# Patient Record
Sex: Female | Born: 1945 | Race: White | Hispanic: No | Marital: Married | Smoking: Never smoker
Health system: Southern US, Community
[De-identification: ages and names within clinical notes are randomized; demographics above are authoritative.]

## PROBLEM LIST (undated history)

## (undated) DIAGNOSIS — R519 Headache, unspecified: Secondary | ICD-10-CM

## (undated) DIAGNOSIS — Z9889 Other specified postprocedural states: Secondary | ICD-10-CM

## (undated) DIAGNOSIS — R112 Nausea with vomiting, unspecified: Secondary | ICD-10-CM

## (undated) DIAGNOSIS — M1990 Unspecified osteoarthritis, unspecified site: Secondary | ICD-10-CM

## (undated) DIAGNOSIS — G8929 Other chronic pain: Secondary | ICD-10-CM

## (undated) DIAGNOSIS — G43909 Migraine, unspecified, not intractable, without status migrainosus: Secondary | ICD-10-CM

## (undated) DIAGNOSIS — R51 Headache: Secondary | ICD-10-CM

## (undated) DIAGNOSIS — F419 Anxiety disorder, unspecified: Secondary | ICD-10-CM

## (undated) DIAGNOSIS — M545 Low back pain, unspecified: Secondary | ICD-10-CM

## (undated) DIAGNOSIS — R7989 Other specified abnormal findings of blood chemistry: Secondary | ICD-10-CM

## (undated) DIAGNOSIS — E7800 Pure hypercholesterolemia, unspecified: Secondary | ICD-10-CM

## (undated) DIAGNOSIS — I10 Essential (primary) hypertension: Secondary | ICD-10-CM

## (undated) HISTORY — PX: CHOLECYSTECTOMY: SHX55

## (undated) HISTORY — PX: ANTERIOR CERVICAL DECOMP/DISCECTOMY FUSION: SHX1161

---
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
## 1978-03-09 HISTORY — PX: TUBAL LIGATION: SHX77

## 1978-11-08 HISTORY — PX: APPENDECTOMY: SHX54

## 1978-11-08 HISTORY — PX: ABDOMINAL HYSTERECTOMY: SHX81

## 1978-11-08 HISTORY — PX: BILATERAL OOPHORECTOMY: SHX1221

## 1998-06-03 ENCOUNTER — Other Ambulatory Visit: Admission: RE | Admit: 1998-06-03 | Discharge: 1998-06-03 | Payer: Self-pay | Admitting: Obstetrics and Gynecology

## 1999-09-17 ENCOUNTER — Other Ambulatory Visit: Admission: RE | Admit: 1999-09-17 | Discharge: 1999-09-17 | Payer: Self-pay | Admitting: Obstetrics and Gynecology

## 1999-09-17 ENCOUNTER — Encounter: Payer: Self-pay | Admitting: Obstetrics and Gynecology

## 1999-09-17 ENCOUNTER — Encounter: Admission: RE | Admit: 1999-09-17 | Discharge: 1999-09-17 | Payer: Self-pay | Admitting: Obstetrics and Gynecology

## 2000-11-02 ENCOUNTER — Other Ambulatory Visit: Admission: RE | Admit: 2000-11-02 | Discharge: 2000-11-02 | Payer: Self-pay | Admitting: Obstetrics and Gynecology

## 2000-11-10 ENCOUNTER — Encounter: Payer: Self-pay | Admitting: Obstetrics and Gynecology

## 2000-11-10 ENCOUNTER — Encounter: Admission: RE | Admit: 2000-11-10 | Discharge: 2000-11-10 | Payer: Self-pay | Admitting: Obstetrics and Gynecology

## 2001-10-25 ENCOUNTER — Encounter: Payer: Self-pay | Admitting: Orthopaedic Surgery

## 2001-10-26 ENCOUNTER — Inpatient Hospital Stay (HOSPITAL_COMMUNITY): Admission: RE | Admit: 2001-10-26 | Discharge: 2001-10-28 | Payer: Self-pay | Admitting: Orthopaedic Surgery

## 2001-10-26 ENCOUNTER — Encounter: Payer: Self-pay | Admitting: Orthopaedic Surgery

## 2003-01-09 ENCOUNTER — Other Ambulatory Visit: Admission: RE | Admit: 2003-01-09 | Discharge: 2003-01-09 | Payer: Self-pay | Admitting: Obstetrics and Gynecology

## 2003-01-18 ENCOUNTER — Encounter: Admission: RE | Admit: 2003-01-18 | Discharge: 2003-01-18 | Payer: Self-pay | Admitting: Obstetrics and Gynecology

## 2003-07-04 ENCOUNTER — Ambulatory Visit (HOSPITAL_COMMUNITY): Admission: RE | Admit: 2003-07-04 | Discharge: 2003-07-04 | Payer: Self-pay | Admitting: Family Medicine

## 2003-08-18 ENCOUNTER — Ambulatory Visit (HOSPITAL_COMMUNITY): Admission: RE | Admit: 2003-08-18 | Discharge: 2003-08-18 | Payer: Self-pay | Admitting: Unknown Physician Specialty

## 2004-02-19 ENCOUNTER — Ambulatory Visit: Payer: Self-pay | Admitting: Family Medicine

## 2004-06-10 ENCOUNTER — Encounter: Admission: RE | Admit: 2004-06-10 | Discharge: 2004-06-10 | Payer: Self-pay | Admitting: Obstetrics and Gynecology

## 2005-01-05 ENCOUNTER — Ambulatory Visit: Payer: Self-pay | Admitting: Family Medicine

## 2005-03-18 ENCOUNTER — Ambulatory Visit: Payer: Self-pay | Admitting: Family Medicine

## 2005-04-03 ENCOUNTER — Ambulatory Visit: Payer: Self-pay | Admitting: Family Medicine

## 2005-06-16 ENCOUNTER — Ambulatory Visit: Payer: Self-pay | Admitting: Internal Medicine

## 2005-06-19 ENCOUNTER — Encounter: Admission: RE | Admit: 2005-06-19 | Discharge: 2005-06-19 | Payer: Self-pay | Admitting: Obstetrics and Gynecology

## 2005-10-23 ENCOUNTER — Encounter: Admission: RE | Admit: 2005-10-23 | Discharge: 2005-10-23 | Payer: Self-pay | Admitting: Orthopaedic Surgery

## 2005-12-01 ENCOUNTER — Ambulatory Visit: Payer: Self-pay | Admitting: Family Medicine

## 2006-05-18 ENCOUNTER — Ambulatory Visit: Payer: Self-pay | Admitting: Family Medicine

## 2006-11-15 ENCOUNTER — Emergency Department (HOSPITAL_COMMUNITY): Admission: EM | Admit: 2006-11-15 | Discharge: 2006-11-15 | Payer: Self-pay | Admitting: Emergency Medicine

## 2007-08-25 ENCOUNTER — Encounter: Admission: RE | Admit: 2007-08-25 | Discharge: 2007-08-25 | Payer: Self-pay | Admitting: Obstetrics and Gynecology

## 2009-02-12 ENCOUNTER — Encounter: Admission: RE | Admit: 2009-02-12 | Discharge: 2009-02-12 | Payer: Self-pay | Admitting: Obstetrics and Gynecology

## 2010-07-21 ENCOUNTER — Other Ambulatory Visit: Payer: Self-pay | Admitting: Orthopaedic Surgery

## 2010-07-21 DIAGNOSIS — G8929 Other chronic pain: Secondary | ICD-10-CM

## 2010-07-21 DIAGNOSIS — M545 Low back pain: Secondary | ICD-10-CM

## 2010-07-25 ENCOUNTER — Other Ambulatory Visit: Payer: Self-pay

## 2010-07-25 NOTE — Op Note (Signed)
NAME:  Jenny Martinez, Jenny Martinez NO.:  [id number]   MEDICAL RECORD NO.:  [id number]                   PATIENT TYPE:  INP   LOCATION:  2857                                 FACILITY:  [facility]   PHYSICIAN:  Patricia Nettle, M.D.                  DATE OF BIRTH:  May 11, 1945   DATE OF PROCEDURE:  DATE OF DISCHARGE:                                 OPERATIVE REPORT   PREOPERATIVE DIAGNOSES:  1. Degenerative disk disease with herniated nucleus pulposus, cervical 5-6,     cervical 6-7.  2. Spinal stenosis.  3. Right cervical 6 radiculopathy.   OPERATIONS PERFORMED:  1. Anterior cervical diskectomy with decompression of the spinal cord and     nerve root bilaterally at cervical 5-6 and cervical 6-7.  2. Anterior cervical arthrodesis, cervical 5-6 and cervical 6-7 with     placement of two VG3C allograft spacers.  3. Anterior cervical plating, cervical 5 through 7, utilizing the Synthesis     small stature plating system.  4. Neuromonitoring using SSEP's  and __________ evoked potentials.   SURGEON:  Patricia Nettle, M.D.   ASSISTANT:  Colleen Mahar, P. A.   ANESTHESIA:  General endotracheal.   COMPLICATIONS:  None.   INDICATIONS:  The patient is a 65 year old female with a 6+ week history of  progressively worsening neck and right upper extremity pain.  Her symptoms  are consistent with a right C6 radiculopathy.  X-rays showed degenerative  changes were more significant at C5-6, C6-7.  Her MRI scan shows a large  right paracentral disk herniation that narrows the AP diameter of the thecal  sac to 8 mm and causing marked right foraminal stenosis.  At C6-7 there is a  broad-based shallow disk herniation, which narrows the AP diameter of the  thecal sac to 9 mm.  There is mild right foraminal stenosis.  Because of the  patient's persistent and progressive symptoms  she has elected to undergo  anterior cervical diskectomy and fusion, C5-6 and C6-7, in hopes of  improving  her symptoms.   DESCRIPTION OF OPERATION:  The patient was properly identified in the  holding area and brought to the operating room.  The patient underwent  general endotracheal anesthesia without difficulty.  She was given  prophylactic IV antibiotics.  Care was taken not to hyperextend her neck.  Neuromonitoring was established in the form of SSEP and motor evoked  potentials, and these were monitored throughout the procedure.  She was  positioned with slight hyperextension of the neck.  Her shoulders were  gently taped down.  The neck was prepped and draped in the usual sterile  fashion.  A 4 cm incision was made in a natural skin crease at the level of  the cricoid cartilage.  This was carried down to the platysma.  Platysma was  incised sharply.  The plane between the sternocleidomastoid and the strap  muscles medially was developed.  The tracheoesophagus was identified  medially and the carotid sheath laterally.  These structures were protected  at all times.  We identified the bones and disks of the anterior cervical  spine.  A spinal needle was placed at C5-6 and x-ray confirmed this  location.  We then elevated the longus colli muscle over the C5-6, C6-7 disk  spaces.  Care was taken to protect the disks above and below.  We placed our  deep Shadowline retractor.   A subtotal diskectomy was performed at C5-6 using pituitary rongeurs and  curets.  We then brought in the microscope and with microdissection  technique the posterior longitudinal  ligament was exposed using  microcurets.  We used the high-speed bur to remove the uncovertebral  osteophytes.  The posterior longitudinal ligament was taken down and a  subligamentous disk herniation was removed along the right side of the  spinal canal.  The posterior cortex was undercut with the Kerrison's and  when we were done the posterior longitudinal ligament had been taken down in  its entirety.  All disk material was  removed.   Foraminotomies were performed and a nerve hook confirmed that the axillary  nerves were patent.  We then used trial spacers, identified a size 8 graft.  This was thawed and then carefully  tapped into position.  All bleeding was  controlled with Gelfoam and bipolar electrocautery.   We performed a similar procedure at C6-7.  This was a very tight disk space.  There was very little disk material.  We then took down the posterior  longitudinal ligament and undercut the posterior vertebral margins.  A size  7 bone graft was placed.  We then chose a 36 mm Syntheses small stature  plate and used six screws; two in each vertebral body at C5, 6 and 7.  We  placed the appropriate interlocking screws.  The wound was irrigated.  A  final intraoperative x-ray was taken.  This showed successful positioning of  the plate and bone grafts, although it was difficult to visualize the  inferior portion of the plate because of the patient's shoulders.  A deep  Penrose drain was placed.  The platysma was closed with a 2-0 interrupted  Vicryl.  Subcutaneous layer was closed with 3-0 Vicryl followed by a running  subcuticular 4-0 Vicryl.  Sterile dressing was applied.   There were no deleterious changes on the neuromonitoring throughout the  procedure.   The patient was extubated, able to move her upper and lower extremities, and  she was transferred to recovery room in stable condition.                                                 Patricia Nettle, M.D.    MWC/MEDQ  D:  10/26/2001  T:  10/29/2001  Job:  [id number]

## 2010-07-30 ENCOUNTER — Ambulatory Visit
Admission: RE | Admit: 2010-07-30 | Discharge: 2010-07-30 | Disposition: A | Discharge status: Home / Self Care | Source: Ambulatory Visit | Attending: Orthopaedic Surgery | Admitting: Orthopaedic Surgery

## 2010-07-30 DIAGNOSIS — M545 Low back pain: Secondary | ICD-10-CM

## 2010-07-30 DIAGNOSIS — G8929 Other chronic pain: Secondary | ICD-10-CM

## 2010-09-07 HISTORY — PX: POSTERIOR LUMBAR FUSION: SHX6036

## 2010-11-05 ENCOUNTER — Other Ambulatory Visit: Payer: Self-pay | Admitting: Obstetrics and Gynecology

## 2010-11-05 DIAGNOSIS — Z1231 Encounter for screening mammogram for malignant neoplasm of breast: Secondary | ICD-10-CM

## 2010-11-18 ENCOUNTER — Ambulatory Visit
Admission: RE | Admit: 2010-11-18 | Discharge: 2010-11-18 | Disposition: A | Discharge status: Home / Self Care | Source: Ambulatory Visit | Attending: Obstetrics and Gynecology | Admitting: Obstetrics and Gynecology

## 2010-11-18 DIAGNOSIS — Z1231 Encounter for screening mammogram for malignant neoplasm of breast: Secondary | ICD-10-CM

## 2010-12-19 LAB — CBC
Hemoglobin: 12.6
MCHC: 33.8
MCV: 86.8
Platelets: 295
RBC: 4.29

## 2010-12-19 LAB — POCT CARDIAC MARKERS
Myoglobin, poc: 53
Operator id: 151321
Operator id: 151321
Troponin i, poc: <0.05
Troponin i, poc: <0.05

## 2010-12-19 LAB — POCT I-STAT CREATININE
Creatinine, Ser: 1
Operator id: 151321

## 2010-12-19 LAB — DIFFERENTIAL
Basophils Absolute: 0
Eosinophils Relative: 1

## 2010-12-19 LAB — I-STAT 8, (EC8 V) (CONVERTED LAB)
BUN: 12
Glucose, Bld: 97
HCT: 40
Operator id: 151321
pCO2, Ven: 44.4 — ABNORMAL LOW

## 2012-07-21 ENCOUNTER — Inpatient Hospital Stay (HOSPITAL_COMMUNITY)
Admission: EM | Admit: 2012-07-21 | Discharge: 2012-07-23 | DRG: 287 | Disposition: A | Discharge status: Home / Self Care | Payer: Medicare Other | Attending: Cardiology | Admitting: Cardiology

## 2012-07-21 ENCOUNTER — Encounter (HOSPITAL_COMMUNITY): Payer: Self-pay

## 2012-07-21 ENCOUNTER — Emergency Department (HOSPITAL_COMMUNITY): Payer: Medicare Other

## 2012-07-21 DIAGNOSIS — R079 Chest pain, unspecified: Secondary | ICD-10-CM

## 2012-07-21 DIAGNOSIS — F411 Generalized anxiety disorder: Secondary | ICD-10-CM | POA: Diagnosis present

## 2012-07-21 DIAGNOSIS — G8929 Other chronic pain: Secondary | ICD-10-CM | POA: Diagnosis present

## 2012-07-21 DIAGNOSIS — E7800 Pure hypercholesterolemia, unspecified: Secondary | ICD-10-CM | POA: Diagnosis present

## 2012-07-21 DIAGNOSIS — Z888 Allergy status to other drugs, medicaments and biological substances status: Secondary | ICD-10-CM

## 2012-07-21 DIAGNOSIS — R7989 Other specified abnormal findings of blood chemistry: Secondary | ICD-10-CM

## 2012-07-21 DIAGNOSIS — R072 Precordial pain: Principal | ICD-10-CM

## 2012-07-21 DIAGNOSIS — Z79899 Other long term (current) drug therapy: Secondary | ICD-10-CM

## 2012-07-21 DIAGNOSIS — D649 Anemia, unspecified: Secondary | ICD-10-CM | POA: Diagnosis present

## 2012-07-21 DIAGNOSIS — I10 Essential (primary) hypertension: Secondary | ICD-10-CM | POA: Diagnosis present

## 2012-07-21 DIAGNOSIS — M549 Dorsalgia, unspecified: Secondary | ICD-10-CM | POA: Diagnosis present

## 2012-07-21 DIAGNOSIS — Z8249 Family history of ischemic heart disease and other diseases of the circulatory system: Secondary | ICD-10-CM

## 2012-07-21 DIAGNOSIS — G43909 Migraine, unspecified, not intractable, without status migrainosus: Secondary | ICD-10-CM | POA: Diagnosis present

## 2012-07-21 DIAGNOSIS — M129 Arthropathy, unspecified: Secondary | ICD-10-CM | POA: Diagnosis present

## 2012-07-21 HISTORY — DX: Other chronic pain: G89.29

## 2012-07-21 HISTORY — DX: Headache, unspecified: R51.9

## 2012-07-21 HISTORY — DX: Unspecified osteoarthritis, unspecified site: M19.90

## 2012-07-21 HISTORY — DX: Anxiety disorder, unspecified: F41.9

## 2012-07-21 HISTORY — DX: Other specified postprocedural states: R11.2

## 2012-07-21 HISTORY — DX: Other specified abnormal findings of blood chemistry: R79.89

## 2012-07-21 HISTORY — DX: Low back pain: M54.5

## 2012-07-21 HISTORY — DX: Essential (primary) hypertension: I10

## 2012-07-21 HISTORY — DX: Low back pain, unspecified: M54.50

## 2012-07-21 HISTORY — DX: Headache: R51

## 2012-07-21 HISTORY — DX: Other specified postprocedural states: Z98.890

## 2012-07-21 HISTORY — DX: Pure hypercholesterolemia, unspecified: E78.00

## 2012-07-21 HISTORY — DX: Migraine, unspecified, not intractable, without status migrainosus: G43.909

## 2012-07-21 LAB — CBC
Hemoglobin: 13 g/dL (ref 12.0–15.0)
MCHC: 33.2 g/dL (ref 30.0–36.0)
MCV: 86.5 fL (ref 78.0–100.0)
Platelets: 246 10*3/uL (ref 150–400)
RBC: 4.52 MIL/uL (ref 3.87–5.11)
RDW: 13.6 % (ref 11.5–15.5)

## 2012-07-21 LAB — BASIC METABOLIC PANEL
BUN: 14 mg/dL (ref 6–23)
CO2: 28 mEq/L (ref 19–32)
Calcium: 9.4 mg/dL (ref 8.4–10.5)
GFR calc non Af Amer: 55 mL/min — ABNORMAL LOW (ref >90)
Sodium: 141 mEq/L (ref 135–145)

## 2012-07-21 LAB — POCT I-STAT TROPONIN I: Troponin i, poc: 0 ng/mL (ref 0.00–0.08)

## 2012-07-21 LAB — TROPONIN I: Troponin I: <0.3 ng/mL (ref <0.30)

## 2012-07-21 MED ORDER — ALPRAZOLAM 0.25 MG PO TABS
0.5000 mg | ORAL_TABLET | Freq: Every day | ORAL | Status: DC | PRN
  Administered 2012-07-21: 0.5 mg via ORAL
  Filled 2012-07-21: qty 1

## 2012-07-21 MED ORDER — HEPARIN SODIUM (PORCINE) 5000 UNIT/ML IJ SOLN
5000.0000 [IU] | Freq: Three times a day (TID) | INTRAMUSCULAR | Status: DC
  Administered 2012-07-21 – 2012-07-22 (×2): 5000 [IU] via SUBCUTANEOUS
  Filled 2012-07-21 (×6): qty 1

## 2012-07-21 MED ORDER — CYCLOBENZAPRINE HCL 10 MG PO TABS
10.0000 mg | ORAL_TABLET | Freq: Every day | ORAL | Status: DC | PRN
Start: 2012-07-21 — End: 2012-07-23
  Administered 2012-07-21: 10 mg via ORAL
  Filled 2012-07-21: qty 1

## 2012-07-21 MED ORDER — NITROGLYCERIN 0.4 MG SL SUBL: 0.4000 mg | SUBLINGUAL_TABLET | SUBLINGUAL | Status: DC | PRN

## 2012-07-21 MED ORDER — ACETAMINOPHEN 325 MG PO TABS
650.0000 mg | ORAL_TABLET | ORAL | Status: DC | PRN
  Administered 2012-07-22 (×2): 650 mg via ORAL
  Filled 2012-07-21 (×2): qty 2

## 2012-07-21 MED ORDER — SODIUM CHLORIDE 0.9 % IV SOLN
INTRAVENOUS | Status: DC
  Administered 2012-07-21 – 2012-07-22 (×2): via INTRAVENOUS

## 2012-07-21 MED ORDER — METOPROLOL TARTRATE 25 MG PO TABS
25.0000 mg | ORAL_TABLET | Freq: Two times a day (BID) | ORAL | Status: AC
  Administered 2012-07-21: 25 mg via ORAL
  Filled 2012-07-21: qty 1

## 2012-07-21 MED ORDER — ASPIRIN 300 MG RE SUPP: 300.0000 mg | RECTAL | Status: AC

## 2012-07-21 MED ORDER — ONDANSETRON HCL 4 MG/2ML IJ SOLN: 4.0000 mg | Freq: Four times a day (QID) | INTRAMUSCULAR | Status: DC | PRN

## 2012-07-21 MED ORDER — ATORVASTATIN CALCIUM 80 MG PO TABS
80.0000 mg | ORAL_TABLET | Freq: Every day | ORAL | Status: DC
  Administered 2012-07-21: 80 mg via ORAL
  Filled 2012-07-21 (×2): qty 1

## 2012-07-21 MED ORDER — ASPIRIN 81 MG PO CHEW
324.0000 mg | CHEWABLE_TABLET | ORAL | Status: AC
  Administered 2012-07-21: 324 mg via ORAL
  Filled 2012-07-21: qty 4

## 2012-07-21 MED ORDER — ASPIRIN EC 81 MG PO TBEC
81.0000 mg | DELAYED_RELEASE_TABLET | Freq: Every day | ORAL | Status: DC
  Administered 2012-07-22: 81 mg via ORAL
  Filled 2012-07-21 (×2): qty 1

## 2012-07-21 NOTE — ED Notes (Addendum)
Lt. Sided chest pain began 2 weeks ago, described as pressure, Pain is intermittent.  In the last few days the pain has went into her lt. Shoulder and into her back area.  Pt. Has reported intermittent nausea.  She is dizzy.  Skin is p/w/d. Resp. E/u , Pt. Is also having lt. Jaw pain and intermittent sob

## 2012-07-21 NOTE — H&P (Signed)
History and Physical  Patient ID: Jenny Martinez MRN: [id number], SOB: 1945-08-12 67 y.o. Date of Encounter: 07/21/2012, 2:46 PM  Primary Physician: Josue Hector, MD Primary Cardiologist: None  Chief Complaint: Chest pain  HPI: 67 y.o. female w/ PMHx significant for HTN, who presented to [facility] on 07/21/2012 with complaints of chest pain.  The patient notes a 2-3 week history of chest pain, described as a left-sided "squeezing" pressure pain, which comes and goes, lasting about 4 minutes and resolving spontaneously, which could occur with exertion or at rest.  The pain is associated with lightheadedness and nausea, though no SOB.  The pain had previously been non-radiating, but yesterday radiated to her left upper back, and today radiated to her left jaw and arm.  She notes no aggravating or alleviating factors.  She has not experienced chest pain prior to these last few weeks.  At baseline, she describes herself as "not very active", but she does note that for the last 2-3 weeks she has had new SOB when doing housework.  Her mother had her first MI in her 18's.  She is a non-smoker.  She has taken Estrace, s/p total hysterectomy, for about 30 years.  She notes no LE edema, orthopnea, or PND.  She notes no recent periods of immobility, personal or family history of VTE, or LE pain.  EKG revealed NSR with no acute ST changes. CXR was without acute cardiopulmonary abnormalities. Labs are significant for negative troponin.   Past Medical History  Diagnosis Date  . Hypertension      Surgical History:  Past Surgical History  Procedure Laterality Date  . Abdominal hysterectomy    . Cervical spine surgery    . Cholecystectomy    . Lumbar spine surgery       Home Meds: Metoprolol 25 mg BID Xanax 0.5 mg daily Flexeril 10 mg daily Estrace 1 mg daily (has taken for 30 years)  Allergies:  Allergies  Allergen Reactions  . Levaquin (Levofloxacin In D5w) Nausea And  Vomiting  . Morphine And Related Nausea And Vomiting    History   Social History  . Marital Status: Married    Spouse Name: N/A    Number of Children: N/A  . Years of Education: N/A   Occupational History  . Not on file.   Social History Main Topics  . Smoking status: Never Smoker   . Smokeless tobacco: Not on file  . Alcohol Use: No  . Drug Use: Not on file  . Sexually Active: Not on file   Other Topics Concern  . Not on file   Social History Narrative   Currently retired since age 52.  Previously worked as an Programmer, multimedia for a [employer]  Now does mostly housework, and leads what she describes as "not a very active lifestyle".     Family History  Problem Relation Age of Onset  . Heart attack Mother 24    Review of Systems: General: negative for chills, fever, night sweats or weight changes.  ENT: negative for rhinorrhea or epistaxis Cardiovascular: see HPI Dermatological: negative for rash Respiratory: negative for wheezing GI: negative for vomiting, diarrhea, bright red blood per rectum, melena, or hematemesis GU: no hematuria, urgency, or frequency Neurologic: +occasional headaches. negative for visual changes, syncope, or dizziness Heme: no easy bruising or bleeding Endo: negative for excessive thirst, thyroid disorder, or flushing Musculoskeletal: negative for joint pain or swelling, negative for myalgias All other systems reviewed and are otherwise  negative except as noted above.  Physical Exam: Blood pressure 134/58, pulse 84, temperature 97.6 F (36.4 C), resp. rate 15, SpO2 100.00%. General: Well developed, well nourished, alert and oriented, in no acute distress. HEENT: Normocephalic, atraumatic, sclera non-icteric, no xanthomas, nares are without discharge.  Neck: Supple. Carotids 2+ without bruits. JVP normal  Lungs: Clear bilaterally to auscultation without wheezes, rales, or rhonchi. Breathing is unlabored. Heart: RRR with normal S1 and  S2. No murmurs, rubs, or gallops appreciated. Abdomen: Soft, non-tender, non-distended with normoactive bowel sounds. No hepatomegaly. No rebound/guarding. No obvious abdominal masses. Back: No CVA tenderness Msk:  Strength and tone appear normal for age. Extremities: No clubbing, cyanosis, or edema.   Neuro: CNII-XII intact, moves all extremities spontaneously. Psych:  Responds to questions appropriately with a normal affect.   Labs:   Lab Results  Component Value Date   WBC 7.4 07/21/2012   HGB 13.0 07/21/2012   HCT 39.1 07/21/2012   MCV 86.5 07/21/2012   PLT 246 07/21/2012    Recent Labs Lab 07/21/12 1233  NA 141  K 4.0  CL 103  CO2 28  BUN 14  CREATININE 1.05  CALCIUM 9.4  GLUCOSE 87   No results found for this basename: CKTOTAL, CKMB, TROPONINI,  in the last 72 hours No results found for this basename: CHOL, HDL, LDLCALC, TRIG   Lab Results  Component Value Date   DDIMER  Value: <0.22        AT THE INHOUSE ESTABLISHED CUTOFF VALUE OF 0.48 ug/mL FEU, THIS ASSAY HAS BEEN DOCUMENTED IN THE LITERATURE TO HAVE 11/15/2006    Radiology/Studies:  Dg Chest 2 View  07/21/2012   *RADIOLOGY REPORT*  Clinical Data: Chest pain.  CHEST - 2 VIEW  Comparison: 11/15/2006  Findings: Lower cervical plate screw fixator. Otherwise negative exam.  IMPRESSION:  1.  No significant abnormality identified.   Original Report Authenticated By: Gaylyn Rong, M.D.     EKG: NSR, no ST changes  ASSESSMENT AND PLAN:  The patient is a 67 yo woman, history of HTN, FH of MI, and estrogen use, presenting with chest pain.  # Atypical chest pain - the patient presents with left-sided, non-exertional CP, with a history of HTN, FH of MI, and 30 years of estrogen use.  Differential includes unstable angina (TIMI score = 4, intermediate, due to use of aspirin, CAD RF, age, and 2 anginal episodes) vs GERD (daily aspirin use for pain) vs msk.  Unlikely PE (Wells score = 0, though patient does use estrogen).  No  evidence of pneumothorax (CXR clear) vs PNA (no leukocytosis, fever). -admit to telemetry -cycle CE's x3 -start aspirin, atorvastatin -continue metoprolol -plan for stress test in the AM -FLP, A1C for risk stratification -Tylenol, nitro prn for chest pain  # Chronic back pain - at home, patient takes xanax and flexeril -will continue home meds  # Post-menopause - the patient is s/p hysterectomy, and has taken Estrace daily for symptom relief. -hold estrace for now, can likely resume at discharge  # Prophy - heparin  Signed, Janalyn Harder MD 07/21/2012, 2:46 PM  As above, patient seen and examined. Briefly she is a 67 year old female with a past medical history of hypertension who I am asked to evaluate for chest pain. Over the past several weeks she has had intermittent chest pain. It is in the left breast and described as a pressure. The pain is not pleuritic, positional, related to food or exertional. It lasts 4-5 minutes and resolves  spontaneously. She had an episode yesterday that radiated to her back. She had an episode today that radiated to her jaw and left upper extremity. She is presently pain-free. She has some dyspnea on exertion but no orthopnea, PND or pedal edema. No exertional chest pain. Electrocardiogram shows sinus rhythm with no ST changes. Initial enzymes negative. Plan to admit and rule out myocardial infarction with serial enzymes. If negative plan stress echocardiogram tomorrow morning. We'll treat with aspirin and subcutaneous heparin. If enzymes positive she will need catheterization and IV heparin. Olga Millers 3:21 PM

## 2012-07-21 NOTE — ED Provider Notes (Signed)
Medical screening examination/treatment/procedure(s) were conducted as a shared visit with non-physician practitioner(s) and myself.  I personally evaluated the patient during the encounter.   Squeezing anterior chest pain intermittently for 2 weeks.  Mother had early heart disease.   EKG and troponin negative. Consult cardiology.  Donnetta Hutching, MD 07/21/12 [phone]

## 2012-07-21 NOTE — ED Provider Notes (Signed)
History     CSN: [id number]  Arrival date & time 07/21/12  1219   First MD Initiated Contact with Patient 07/21/12 1235      Chief Complaint  Patient presents with  . Chest Pain    (Consider location/radiation/quality/duration/timing/severity/associated sxs/prior treatment) HPI Comments: Patient with a PMH significant for HTN presents to the ED for chest pain. States over the past 2-3 weeks she has been having intermittent episodes of left-sided chest tightness described as a squeezing sensation. Episodes of pain associated with nausea, SOB and dizziness.  At times the pain will radiate into her left shoulder and into the left side of her jaw.  Pain is not related to exertion.  No personal history of heart disease or MI.  Significant family history, patient's mother had several MI's and dx with CAD at early age.  Recently switched to generic Toprol for HTN which she states she has taken in the past with ADRs- she is unsure if this is related to her sx.  Denies any numbness or paresthesias of upper or lower extremities, weakness, confusion, or AMS.  No abdominal pain, vomiting, diarrhea, or urinary sx.  PCP- Dr. Lysbeth Galas  Patient is a 67 y.o. female presenting with chest pain. The history is provided by the patient.  Chest Pain   Past Medical History  Diagnosis Date  . Hypertension     Past Surgical History  Procedure Laterality Date  . Abdominal hysterectomy    . Cervical spine surgery    . Cholecystectomy    . Lumbar spine surgery      No family history on file.  History  Substance Use Topics  . Smoking status: Never Smoker   . Smokeless tobacco: Not on file  . Alcohol Use: No    OB History   Grav Para Term Preterm Abortions TAB SAB Ect Mult Living                  Review of Systems  Cardiovascular: Positive for chest pain.  All other systems reviewed and are negative.    Allergies  Levaquin and Morphine and related  Home Medications  No current outpatient  prescriptions on file.  BP 164/75  Pulse 82  Temp(Src) 97.6 F (36.4 C)  Resp 18  SpO2 100%  Physical Exam  Nursing note and vitals reviewed. Constitutional: She is oriented to person, place, and time. She appears well-developed and well-nourished. No distress.  HENT:  Head: Normocephalic and atraumatic.  Mouth/Throat: Oropharynx is clear and moist.  Eyes: Conjunctivae and EOM are normal. Pupils are equal, round, and reactive to light.  Neck: Normal range of motion.  Cardiovascular: Normal rate, regular rhythm and normal heart sounds.   Pulmonary/Chest: Effort normal and breath sounds normal. No respiratory distress.  Chest pain not reproducible with palpation to chest wall  Abdominal: Soft. Bowel sounds are normal. There is no tenderness. There is no guarding.  Musculoskeletal: Normal range of motion. She exhibits no edema.  No calf asymmetry, TTP, or palpable cord appreciated, negative Homan's sign, strong distal pulses  Neurological: She is alert and oriented to person, place, and time.  Skin: Skin is warm and dry.  Psychiatric: She has a normal mood and affect.    ED Course  Procedures (including critical care time)   Date: 07/21/2012  Rate: 87  Rhythm: normal sinus rhythm  QRS Axis: normal  Intervals: normal  ST/T Wave abnormalities: normal  Conduction Disutrbances:none  Narrative Interpretation: NSR, no STEMI  Old EKG Reviewed:  none available    Labs Reviewed  BASIC METABOLIC PANEL - Abnormal; Notable for the following:    GFR calc non Af Amer 55 (*)    GFR calc Af Amer 63 (*)    All other components within normal limits  CBC  POCT I-STAT TROPONIN I   Dg Chest 2 View  07/21/2012   *RADIOLOGY REPORT*  Clinical Data: Chest pain.  CHEST - 2 VIEW  Comparison: 11/15/2006  Findings: Lower cervical plate screw fixator. Otherwise negative exam.  IMPRESSION:  1.  No significant abnormality identified.   Original Report Authenticated By: Gaylyn Rong, M.D.      1. Precordial pain   2. Chest pain       MDM   67 y.o. F presenting to the ED for "squeezing" left sided chest pain intermittently x 2 weeks, now radiating into left shoulder and left side of jaw.    EKG NSR, no acute ischemic changes.  Trop negative.  Labs largely WNL.  Signs/sx concerning for cardiac events.  Cardiology consulted- she will be admitted for CP r/o with stress echo to be completed tomorrow morning.  VS stable for transfer.     Garlon Hatchet, PA-C 07/21/12 [phone]

## 2012-07-22 ENCOUNTER — Encounter (HOSPITAL_COMMUNITY)
Admission: EM | Disposition: A | Discharge status: Home / Self Care | Payer: Self-pay | Source: Home / Self Care | Attending: Cardiology

## 2012-07-22 DIAGNOSIS — R7989 Other specified abnormal findings of blood chemistry: Secondary | ICD-10-CM

## 2012-07-22 DIAGNOSIS — R072 Precordial pain: Secondary | ICD-10-CM

## 2012-07-22 DIAGNOSIS — R079 Chest pain, unspecified: Secondary | ICD-10-CM

## 2012-07-22 HISTORY — PX: LEFT HEART CATHETERIZATION WITH CORONARY ANGIOGRAM: SHX5451

## 2012-07-22 LAB — HEMOGLOBIN A1C
Hgb A1c MFr Bld: 5.6 % (ref <5.7)
Mean Plasma Glucose: 114 mg/dL (ref <117)

## 2012-07-22 LAB — TROPONIN I: Troponin I: <0.3 ng/mL (ref <0.30)

## 2012-07-22 LAB — CBC
MCV: 86.6 fL (ref 78.0–100.0)
Platelets: 224 10*3/uL (ref 150–400)
RBC: 3.95 MIL/uL (ref 3.87–5.11)
RDW: 13.7 % (ref 11.5–15.5)
WBC: 6.5 10*3/uL (ref 4.0–10.5)

## 2012-07-22 LAB — LIPID PANEL
Cholesterol: 190 mg/dL (ref 0–200)
Total CHOL/HDL Ratio: 3.6 RATIO

## 2012-07-22 LAB — PROTIME-INR
INR: 1.03 (ref 0.00–1.49)
Prothrombin Time: 13.4 seconds (ref 11.6–15.2)

## 2012-07-22 LAB — BASIC METABOLIC PANEL
CO2: 28 mEq/L (ref 19–32)
Chloride: 103 mEq/L (ref 96–112)
Creatinine, Ser: 1.08 mg/dL (ref 0.50–1.10)
GFR calc Af Amer: 61 mL/min — ABNORMAL LOW (ref >90)
Potassium: 4 mEq/L (ref 3.5–5.1)
Sodium: 139 mEq/L (ref 135–145)

## 2012-07-22 LAB — TSH: TSH: 7.237 u[IU]/mL — ABNORMAL HIGH (ref 0.350–4.500)

## 2012-07-22 SURGERY — LEFT HEART CATHETERIZATION WITH CORONARY ANGIOGRAM
Anesthesia: LOCAL

## 2012-07-22 MED ORDER — SODIUM CHLORIDE 0.9 % IV SOLN: 250.0000 mL | INTRAVENOUS | Status: DC | PRN

## 2012-07-22 MED ORDER — MIDAZOLAM HCL 2 MG/2ML IJ SOLN
INTRAMUSCULAR | Status: AC
  Filled 2012-07-22: qty 2

## 2012-07-22 MED ORDER — SODIUM CHLORIDE 0.9 % IJ SOLN: 3.0000 mL | Freq: Two times a day (BID) | INTRAMUSCULAR | Status: DC

## 2012-07-22 MED ORDER — LIDOCAINE HCL (PF) 1 % IJ SOLN
INTRAMUSCULAR | Status: AC
  Filled 2012-07-22: qty 30

## 2012-07-22 MED ORDER — SODIUM CHLORIDE 0.9 % IV SOLN: INTRAVENOUS | Status: AC

## 2012-07-22 MED ORDER — SODIUM CHLORIDE 0.9 % IV SOLN: 1.0000 mL/kg/h | INTRAVENOUS | Status: DC

## 2012-07-22 MED ORDER — DIAZEPAM 2 MG PO TABS: 2.0000 mg | ORAL_TABLET | ORAL | Status: DC

## 2012-07-22 MED ORDER — HEPARIN (PORCINE) IN NACL 2-0.9 UNIT/ML-% IJ SOLN
INTRAMUSCULAR | Status: AC
  Filled 2012-07-22: qty 1000

## 2012-07-22 MED ORDER — SODIUM CHLORIDE 0.9 % IJ SOLN: 3.0000 mL | INTRAMUSCULAR | Status: DC | PRN

## 2012-07-22 NOTE — Progress Notes (Signed)
   Subjective:  Denies CP or dyspnea   Objective:  Filed Vitals:   07/21/12 1730 07/21/12 1745 07/21/12 1817 07/21/12 2055  BP: 120/57 112/47 126/77 147/74  Pulse: 93 91 82 83  Temp:   97.9 F (36.6 C) 97.6 F (36.4 C)  TempSrc:   Oral   Resp: 17 18 18 18   Height:   5\' 6"  (1.676 m)   Weight:   152 lb 5.4 oz (69.1 kg)   SpO2: 99% 99% 100% 97%    Intake/Output from previous day: No intake or output data in the 24 hours ending 07/22/12 0658  Physical Exam: Physical exam: Well-developed well-nourished in no acute distress.  Skin is warm and dry.  HEENT is normal.  Neck is supple.  Chest is clear to auscultation with normal expansion.  Cardiovascular exam is regular rate and rhythm.  Abdominal exam nontender or distended. No masses palpated. Extremities show no edema. neuro grossly intact    Lab Results: Basic Metabolic Panel:  Recent Labs  96/04/54 1233 07/22/12 0102  NA 141 139  K 4.0 4.0  CL 103 103  CO2 28 28  GLUCOSE 87 97  BUN 14 15  CREATININE 1.05 1.08  CALCIUM 9.4 9.2   CBC:  Recent Labs  07/21/12 1233 07/22/12 0102  WBC 7.4 6.5  HGB 13.0 11.7*  HCT 39.1 34.2*  MCV 86.5 86.6  PLT 246 224   Cardiac Enzymes:  Recent Labs  07/21/12 1921 07/22/12 0103  TROPONINI <0.30 <0.30     Assessment/Plan:  1 chest pain-enzymes negative. Electrocardiogram without ST changes. Plan stress echocardiogram. If negative she can be discharged and followup with her primary care. 2 hypertension-continue preadmission medications.  Olga Millers 07/22/2012, 6:58 AM

## 2012-07-22 NOTE — H&P (View-Only) (Signed)
   Subjective:  Denies CP or dyspnea   Objective:  Filed Vitals:   07/21/12 1730 07/21/12 1745 07/21/12 1817 07/21/12 2055  BP: 120/57 112/47 126/77 147/74  Pulse: 93 91 82 83  Temp:   97.9 F (36.6 C) 97.6 F (36.4 C)  TempSrc:   Oral   Resp: 17 18 18 18  Height:   5' 6" (1.676 m)   Weight:   152 lb 5.4 oz (69.1 kg)   SpO2: 99% 99% 100% 97%    Intake/Output from previous day: No intake or output data in the 24 hours ending 07/22/12 0658  Physical Exam: Physical exam: Well-developed well-nourished in no acute distress.  Skin is warm and dry.  HEENT is normal.  Neck is supple.  Chest is clear to auscultation with normal expansion.  Cardiovascular exam is regular rate and rhythm.  Abdominal exam nontender or distended. No masses palpated. Extremities show no edema. neuro grossly intact    Lab Results: Basic Metabolic Panel:  Recent Labs  07/21/12 1233 07/22/12 0102  NA 141 139  K 4.0 4.0  CL 103 103  CO2 28 28  GLUCOSE 87 97  BUN 14 15  CREATININE 1.05 1.08  CALCIUM 9.4 9.2   CBC:  Recent Labs  07/21/12 1233 07/22/12 0102  WBC 7.4 6.5  HGB 13.0 11.7*  HCT 39.1 34.2*  MCV 86.5 86.6  PLT 246 224   Cardiac Enzymes:  Recent Labs  07/21/12 1921 07/22/12 0103  TROPONINI <0.30 <0.30     Assessment/Plan:  1 chest pain-enzymes negative. Electrocardiogram without ST changes. Plan stress echocardiogram. If negative she can be discharged and followup with her primary care. 2 hypertension-continue preadmission medications.  Brian Crenshaw 07/22/2012, 6:58 AM     

## 2012-07-22 NOTE — Progress Notes (Signed)
*  PRELIMINARY RESULTS* Echocardiogram Echocardiogram Stress Test has been performed.  Jenny Martinez 07/22/2012, 10:25 AM

## 2012-07-22 NOTE — CV Procedure (Signed)
    Cardiac Catheterization Operative Report  Jenny Martinez [id number] 5/16/20144:29 PM Josue Hector, MD  Procedure Performed:  1. Left Heart Catheterization 2. Selective Coronary Angiography 3. Left ventricular angiogram  Operator: Verne Carrow, MD  Arterial access site:  Right radial artery.   Indication: 67 yo female with history of HTN with recent chest pain. Stress echo with possible inferior wall ischemia.                                   Procedure Details: The risks, benefits, complications, treatment options, and expected outcomes were discussed with the patient. The patient and/or family concurred with the proposed plan, giving informed consent. The patient was brought to the cath lab after IV hydration was begun and oral premedication was given. The patient was further sedated with Versed. The right wrist was assessed with an Allens test which was positive. The right wrist was prepped and draped in a sterile fashion. 1% lidocaine was used for local anesthesia. Using the modified Seldinger access technique, a 5 French sheath was placed in the right radial artery. 3 mg Verapamil was given through the sheath. 3000 units IV heparin was given. Standard diagnostic catheters were used to perform selective coronary angiography. A pigtail catheter was used to perform a left ventricular angiogram. The sheath was removed from the right radial artery and a Terumo hemostasis band was applied at the arteriotomy site on the right wrist.   There were no immediate complications. The patient was taken to the recovery area in stable condition.   Hemodynamic Findings: Central aortic pressure: 172/77 Left ventricular pressure: 172/9/15  Angiographic Findings:  Left main: No obstructive disease.  Left Anterior Descending Artery: Large caliber vessel that courses to the apex. Small caliber diagonal branch. No obstructive disease.   Circumflex Artery: Moderate caliber vessel  with small first obtuse marginal branch, moderate caliber second obtuse marginal branch. No obstructive disease.   Right Coronary Artery: Large caliber, dominant vessel with no obstructive disease.   Left Ventricular Angiogram: LVEF=65%.  Impression: 1. No angiographic evidence of CAD 2. Normal LV systolic function 3. Non-cardiac chest pain  Recommendations: No further ischemic workup.        Complications:  None. The patient tolerated the procedure well.

## 2012-07-22 NOTE — Progress Notes (Signed)
Patient arrived to unit, TR band on right wrist with 13 mls in balloon of TR band. 1730 attempted to deflate and noted small hematoma distal and proximal to TR band. No bleeding from site, air immediately replaced in band and held  pressure to areas distal and proximal to band. Attempt at 1800 with same result. 1830 removed 3 mls from TR band and observed for several minutes with no change, no increase in hematoma or bleeding noted. Right arm elevated on pillow.

## 2012-07-22 NOTE — Interval H&P Note (Signed)
History and Physical Interval Note:  07/22/2012 4:09 PM  Jenny Martinez  has presented today for cardiac cath with the diagnosis of cp, abnormal stress test. The various methods of treatment have been discussed with the patient and family. After consideration of risks, benefits and other options for treatment, the patient has consented to  Procedure(s): LEFT HEART CATHETERIZATION WITH CORONARY ANGIOGRAM (N/A) as a surgical intervention .  The patient's history has been reviewed, patient examined, no change in status, stable for surgery.  I have reviewed the patient's chart and labs.  Questions were answered to the patient's satisfaction.     Othelia Riederer

## 2012-07-22 NOTE — Progress Notes (Signed)
TR BAND REMOVAL  LOCATION:    right radial  DEFLATED PER PROTOCOL:    yes  TIME BAND OFF / DRESSING APPLIED:    21:30   SITE UPON ARRIVAL:    Level 1  SITE AFTER BAND REMOVAL:    Level 1  REVERSE ALLEN'S TEST:     positive  CIRCULATION SENSATION AND MOVEMENT:    Within Normal Limits   yes  COMMENTS:

## 2012-07-22 NOTE — Discharge Summary (Signed)
Discharge Summary   Patient ID: Jenny Martinez,  MRN: [id number], DOB/AGE: [birth year] 67 y.o.  Admit date: 07/21/2012 Discharge date: 07/23/2012  Primary Physician: Josue Hector, MD Primary Cardiologist: New- seen by Dr. Jens Som  Discharge Diagnoses Principal Problem:   Precordial pain  - Ruled out for MI- trop-I WNL x 3  - Stress echo 07/22/12: excellent exercise tolerance, walked 1:00 minute at stage 3 Bruce protocol without symptoms or EKG changes; exaggerated BP response   - Stress portion of echo with inferior wall dyskinesis  - Cardiac catheterization 07/22/12 without angiographic evidence of CAD; EF 55-60%  Mild anemia, instructed to f/u PCP  Active Problems:   Hypertension  - Well-controlled throughout the admission  - Continued on outpatient Toprol-XL   Elevated TSH  - Advise follow-up with PCP for further thyroid studies and management   Chronic back pain    Additional History: High cholesterol Headache Migraines Arthritis Anxiety  Allergies Allergies  Allergen Reactions  . Levaquin (Levofloxacin In D5w) Nausea And Vomiting  . Morphine And Related Nausea And Vomiting  . Prednisolone Acetate     Liquid medication.  Made heart race and couldn't catch breath    Diagnostic Studies/Procedures  PA/LATERAL CHEST X-RAY - 07/21/12  No significant abnormality identified.   EXERCISE TOLERANCE TEST + TRANSTHORACIC ECHOCARDIOGRAM - 07/22/12  Baseline:  - LV global systolic function was normal. - Normal wall motion; no LV regional wall motion Abnormalities.  Peak stress:  - LV global systolic function was vigorous. - Probable hypokinesis of the basal and mid inferior LV myocardium.  CARDIAC CATHETERIZATION - 07/22/12  Hemodynamic Findings:  Central aortic pressure: 172/77  Left ventricular pressure: 172/9/15  Angiographic Findings:  Left main: No obstructive disease.  Left Anterior Descending Artery: Large caliber vessel that courses to the  apex. Small caliber diagonal branch. No obstructive disease.  Circumflex Artery: Moderate caliber vessel with small first obtuse marginal branch, moderate caliber second obtuse marginal branch. No obstructive disease.  Right Coronary Artery: Large caliber, dominant vessel with no obstructive disease.  Left Ventricular Angiogram: LVEF=65%.  Impression:  1. No angiographic evidence of CAD  2. Normal LV systolic function  3. Non-cardiac chest pain  History of Present Illness Jenny Martinez is a 67 y.o. female who was admitted to [facility] on 07/21/12 with the above problem list.   She has no prior cardiac history, past medical history significant for hypertension. She reported experiencing a to 3 week history of left-sided chest squeezing lasting approximately 4 minutes and resolving spontaneously. She didn't endorse aggravation on exertion, but also a rest. She reported associated lightheadedness, nausea, but no shortness of breath. There is no radiation to her discomfort until the day prior to admission she experienced left jaw and left arm discomfort. She did note that her mother had her first heart attack in her 85s. She denied tobacco use. Her chest pain persisted thus prompting her ED presentation.  There, EKG revealed normal sinus rhythm without acute ischemic changes. Chest x-ray as above was without acute cardiopulmonary abnormalities. Initial troponin returned within normal limits. Basic metabolic panel and CBC were unremarkable. The patient's chest discomfort was described as atypical, however given her family history of premature CAD and hypertension she was hospitalized for overnight observation and formal rule out. The plan was made to proceed with stress echocardiogram the following morning.  Hospital Course   Two subsequent troponins returned within normal limits. She remained asymptomatic overnight. She underwent stress echocardiogram the following morning which,  as above,  the patient tolerated well with good exercise capacity and no ischemic changes on EKG. However on echocardiogram review, there was evidence of inferior wall dyskinesia. Given this finding, the recommendation was made to proceed with diagnostic cardiac catheterization.  She was informed, consented and prepped for the procedure which revealed no angiographic evidence of CAD, EF 55-60%. She was observed overnight. There was some swelling at her cath site but she kept her arm elevated and intermittently iced with demonstration of stability. She had no arrhythmias overnight. She is not hypoxic, tachycardic or tachypnic. Dr. Myrtis Ser has seen and examined her today and feels she is stable for discharge. She was instructed to f/u PCP for mild anemia (may be related to blood draw/IVF) and elevated TSH. I have left a message on our office's scheduling voicemail requesting a follow-up appointment given her wrist swelling as a general post-cath follow-through. She may not need routine cardiac care thereafter given normal cath. She was also instructed to contact PCP if chest pain continues as she may require GI eval as next step.  Discharge Vitals:  Blood pressure 145/60, pulse 79, temperature 98 F (36.7 C), temperature source Oral, resp. rate 17, height 5\' 6"  (1.676 m), weight 159 lb 13.3 oz (72.5 kg), SpO2 100.00%.  Labs: Recent Labs     07/22/12  0102  07/23/12  0530  WBC  6.5  6.4  HGB  11.7*  11.2*  HCT  34.2*  33.7*  MCV  86.6  86.2  PLT  224  199    Recent Labs Lab 07/21/12 1233 07/22/12 0102 07/23/12 0530  NA 141 139 140  K 4.0 4.0 3.6  CL 103 103 106  CO2 28 28 25   BUN 14 15 12   CREATININE 1.05 1.08 1.00  CALCIUM 9.4 9.2 8.2*  GLUCOSE 87 97 115*   Recent Labs     07/22/12  0102  HGBA1C  5.6   Recent Labs     07/21/12  1921  07/22/12  0103  TROPONINI  <0.30  <0.30    Recent Labs  07/22/12 0102  TSH 7.237*   Follow-up Information   Follow up with Josue Hector, MD.  (Your TSH (thyroid function) was slightly elevated - please contact primary doctor for further evaluation. You were also mildly anemic and this may be related to IV fluids/blood draws, but may need to be followed.)    Contact information:   [address] [facility] [address] [phone]       Follow up with Olga Millers, MD. (Our office will call you for a follow-up appointment. Please call the office if you have not heard from [place] within 3 days.)    Contact information:   [address] [place], [place] [address] [place] [address] [phone]     Disposition: the patient was discharged in stable condition to home.     Discharge Orders   Future Orders Complete By Expires     Diet - low sodium heart healthy  As directed     Discharge instructions  As directed     Comments:      If chest pain continues, please discuss with your primary doctor as you may require gastroenterology evaluation as the next step.    Increase activity slowly  As directed     Scheduling Instructions:      No driving for 2 days. No lifting over 5 lbs for 1 week. No sexual activity for 1 week. Keep procedure site clean & dry.  If you notice increased pain, increased swelling, bleeding or pus, call/return!  You may shower, but no soaking baths/hot tubs/pools for 1 week.        Discharge Medications:    Medication List    TAKE these medications       ALPRAZolam 0.5 MG tablet  Commonly known as:  XANAX  Take 0.5 mg by mouth at bedtime.     ARTHRITIS STRENGTH BC POWDER PO  Take 1 packet by mouth daily.     CENTRUM SILVER PO  Take 1 tablet by mouth daily.     CULTURELLE Caps  Take 1 capsule by mouth daily.     cyclobenzaprine 10 MG tablet  Commonly known as:  FLEXERIL  Take 10 mg by mouth at bedtime.     estradiol 1 MG tablet  Commonly known as:  ESTRACE  Take 1 mg by mouth daily.     metoprolol succinate 25 MG 24 hr tablet  Commonly known as:  TOPROL-XL  Take 25 mg by mouth 2 (two) times daily.      SALINE MIST SPRAY NA  Place 1 spray into the nose as needed (congestion).     SYSTANE ULTRA OP  Place 1 drop into both eyes daily as needed (dry eyes).     VITAMIN D (CHOLECALCIFEROL) PO  Take 1 tablet by mouth daily.       Duration of Discharge Encounter: Greater than 30 minutes including physician time.  Signed, R. Hurman Horn, PA-C 07/22/2012, 5:17 PM  Addendum made by Ronie Spies PA-C  07/23/2012 8:40 AM Patient seen and examined. I agree with the assessment and plan as detailed above. See also my additional thoughts below.   I spoke with the patient and her husband. I made the decision for her to go home. She is stable. I reviewed all the information in the note above. Please see my progress note from today also. She is stable and ready to go home.  Willa Rough, MD, [facility] 07/23/2012 9:59 AM

## 2012-07-23 ENCOUNTER — Encounter (HOSPITAL_COMMUNITY): Payer: Self-pay | Admitting: Physician Assistant

## 2012-07-23 LAB — BASIC METABOLIC PANEL
Calcium: 8.2 mg/dL — ABNORMAL LOW (ref 8.4–10.5)
Chloride: 106 mEq/L (ref 96–112)
Creatinine, Ser: 1 mg/dL (ref 0.50–1.10)
GFR calc Af Amer: 67 mL/min — ABNORMAL LOW (ref >90)
GFR calc non Af Amer: 58 mL/min — ABNORMAL LOW (ref >90)

## 2012-07-23 LAB — CBC
Platelets: 199 10*3/uL (ref 150–400)
RDW: 13.4 % (ref 11.5–15.5)
WBC: 6.4 10*3/uL (ref 4.0–10.5)

## 2012-07-23 MED ORDER — ESTRADIOL 1 MG PO TABS
1.0000 mg | ORAL_TABLET | Freq: Every day | ORAL | Status: DC
  Administered 2012-07-23: 1 mg via ORAL
  Filled 2012-07-23: qty 1

## 2012-07-23 NOTE — Progress Notes (Signed)
Patient ID: Jenny Martinez, female   DOB: [birth year], 67 y.o.   MRN: [id number]   SUBJECTIVE: Patient is doing well. Her cath showed no significant obstructive disease. She is slight swelling at the radial site.   Filed Vitals:   07/22/12 2000 07/23/12 0000 07/23/12 0445 07/23/12 0720  BP: 142/55 141/49 132/51 145/60  Pulse: 83  80 79  Temp: 98.5 F (36.9 C) 97.5 F (36.4 C) 98 F (36.7 C) 98 F (36.7 C)  TempSrc: Oral Oral Oral Oral  Resp: 17 18 17 17   Height:      Weight:   159 lb 13.3 oz (72.5 kg)   SpO2: 99%  100% 100%    Intake/Output Summary (Last 24 hours) at 07/23/12 0820 Last data filed at 07/23/12 0600  Gross per 24 hour  Intake   1062 ml  Output    500 ml  Net    562 ml    LABS: Basic Metabolic Panel:  Recent Labs  95/62/13 0102 07/23/12 0530  NA 139 140  K 4.0 3.6  CL 103 106  CO2 28 25  GLUCOSE 97 115*  BUN 15 12  CREATININE 1.08 1.00  CALCIUM 9.2 8.2*   Liver Function Tests: No results found for this basename: AST, ALT, ALKPHOS, BILITOT, PROT, ALBUMIN,  in the last 72 hours No results found for this basename: LIPASE, AMYLASE,  in the last 72 hours CBC:  Recent Labs  07/22/12 0102 07/23/12 0530  WBC 6.5 6.4  HGB 11.7* 11.2*  HCT 34.2* 33.7*  MCV 86.6 86.2  PLT 224 199   Cardiac Enzymes:  Recent Labs  07/21/12 1921 07/22/12 0103  TROPONINI <0.30 <0.30   BNP: No components found with this basename: POCBNP,  D-Dimer: No results found for this basename: DDIMER,  in the last 72 hours Hemoglobin A1C:  Recent Labs  07/22/12 0102  HGBA1C 5.6   Fasting Lipid Panel:  Recent Labs  07/22/12 0102  CHOL 190  HDL 53  LDLCALC 108*  TRIG 144  CHOLHDL 3.6   Thyroid Function Tests:  Recent Labs  07/22/12 0102  TSH 7.237*    RADIOLOGY: Dg Chest 2 View  07/21/2012   *RADIOLOGY REPORT*  Clinical Data: Chest pain.  CHEST - 2 VIEW  Comparison: 11/15/2006  Findings: Lower cervical plate screw fixator. Otherwise negative exam.   IMPRESSION:  1.  No significant abnormality identified.   Original Report Authenticated By: Gaylyn Rong, M.D.    PHYSICAL EXAM Patient is oriented to person time and place. Affect is normal. Her husband is in the room. There is very slight swelling in the right radial area. She's been keeping her arm elevated and she appears stable.   TELEMETRY: I reviewed telemetry today Jul 23, 2012. There is normal sinus rhythm.   ASSESSMENT AND PLAN:    Precordial pain    Cardiac cath has shown normal coronary arteries. The patient is stable. She can be discharged home.    Hypertension   Chronic back pain   Elevated TSH   Willa Rough 07/23/2012 8:20 AM

## 2012-07-27 ENCOUNTER — Telehealth: Payer: Self-pay | Admitting: Cardiology

## 2012-07-27 NOTE — Telephone Encounter (Signed)
Spoke with pt, aware will need to have wound check from cath. Pt voiced understanding and will call back and make an appt.

## 2012-07-27 NOTE — Telephone Encounter (Signed)
New problem   Pt had cath done 5/16 and want to know since nothing was found does she need to come in for a f/u please call pt.

## 2012-08-03 ENCOUNTER — Telehealth: Payer: Self-pay | Admitting: Cardiology

## 2012-08-04 ENCOUNTER — Encounter: Payer: Self-pay | Admitting: *Deleted

## 2012-08-04 ENCOUNTER — Encounter: Payer: Self-pay | Admitting: Cardiology

## 2012-08-05 ENCOUNTER — Ambulatory Visit (INDEPENDENT_AMBULATORY_CARE_PROVIDER_SITE_OTHER): Payer: Medicare Other | Admitting: Cardiology

## 2012-08-05 ENCOUNTER — Encounter: Payer: Self-pay | Admitting: Cardiology

## 2012-08-05 VITALS — BP 146/80 | HR 96 | Ht 66.0 in | Wt 153.0 lb

## 2012-08-05 DIAGNOSIS — I10 Essential (primary) hypertension: Secondary | ICD-10-CM

## 2012-08-05 DIAGNOSIS — R946 Abnormal results of thyroid function studies: Secondary | ICD-10-CM

## 2012-08-05 DIAGNOSIS — R072 Precordial pain: Secondary | ICD-10-CM

## 2012-08-05 DIAGNOSIS — R7989 Other specified abnormal findings of blood chemistry: Secondary | ICD-10-CM

## 2012-08-05 NOTE — Progress Notes (Signed)
HPI: 67 year old female for followup of chest pain. Patient admitted to [facility] in May of 2014 with complaints of chest pain. She ruled out for myocardial infarction with serial enzymes. Stress echocardiogram in may of 2014 suggested ischemia in the inferior wall. Cardiac catheterization in May of 2014 showed no obstructive coronary disease and an ejection fraction of 65%. She was noted to have mild anemia and elevated TSH and was asked to follow up with primary care for these issues. Since discharge, the patient denies any dyspnea on exertion, orthopnea, PND, pedal edema, palpitations, syncope or chest pain.   Current Outpatient Prescriptions  Medication Sig Dispense Refill  . ALPRAZolam (XANAX) 0.5 MG tablet Take 0.5 mg by mouth at bedtime.      . Aspirin-Salicylamide-Caffeine (ARTHRITIS STRENGTH BC POWDER PO) Take 1 packet by mouth daily.      . cyclobenzaprine (FLEXERIL) 10 MG tablet Take 10 mg by mouth at bedtime.      Marland Kitchen estradiol (ESTRACE) 1 MG tablet Take 1 mg by mouth daily.      . Lactobacillus Rhamnosus, GG, (CULTURELLE) CAPS Take 1 capsule by mouth daily.      . metoprolol succinate (TOPROL-XL) 25 MG 24 hr tablet Take 25 mg by mouth 2 (two) times daily.      . Multiple Vitamins-Minerals (CENTRUM SILVER PO) Take 1 tablet by mouth daily.      Bertram Gala Glycol-Propyl Glycol (SYSTANE ULTRA OP) Place 1 drop into both eyes daily as needed (dry eyes).      . SALINE MIST SPRAY NA Place 1 spray into the nose as needed (congestion).      Marland Kitchen VITAMIN D, CHOLECALCIFEROL, PO Take 1 tablet by mouth daily.       No current facility-administered medications for this visit.     Past Medical History  Diagnosis Date  . Hypertension   . PONV (postoperative nausea and vomiting)   . High cholesterol   . Daily headache   . Migraines     "much better since I went on the BP medicine" (07/21/2012)  . Arthritis     "spine; neck thru lower back" (07/21/2012)  . Chronic lower back pain   .  Anxiety   . H/O cardiac catheterization     No angiographic evidence of CAD 07/2012  . Elevated TSH     a. Noted 07/2012, to f/u pcp.    Past Surgical History  Procedure Laterality Date  . Abdominal hysterectomy  1980's  . Anterior cervical decomp/discectomy fusion  2002?  Marland Kitchen Cholecystectomy  2006?  Marland Kitchen Posterior lumbar fusion  09/2010  . Bilateral oophorectomy  1980's    "2 years after hysterectomy" (07/21/12)  . Tonsillectomy  1954  . Tubal ligation  1980  . Appendectomy  1980's    "w/hysterectomy" (07/21/2012)    History   Social History  . Marital Status: Married    Spouse Name: N/A    Number of Children: N/A  . Years of Education: N/A   Occupational History  . Not on file.   Social History Main Topics  . Smoking status: Never Smoker   . Smokeless tobacco: Never Used  . Alcohol Use: No  . Drug Use: No  . Sexually Active: Yes   Other Topics Concern  . Not on file   Social History Narrative   Currently retired since age 37.  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    ROS: no fevers or chills, productive cough, hemoptysis, dysphasia, odynophagia, melena, hematochezia, dysuria, hematuria, rash, seizure activity, orthopnea, PND, pedal edema, claudication. Remaining systems are negative.  Physical Exam: Well-developed well-nourished in no acute distress.  Skin is warm and dry.  HEENT is normal.  Neck is supple.  Chest is clear to auscultation with normal expansion.  Cardiovascular exam is regular rate and rhythm.  Abdominal exam nontender or distended. No masses palpated. Extremities show no edema. Right radial site without evidence of hematoma. 2+ radial pulse. neuro grossly intact

## 2012-08-05 NOTE — Patient Instructions (Addendum)
Your physician recommends that you schedule a follow-up appointment in: AS NEEDED  

## 2012-08-05 NOTE — Assessment & Plan Note (Signed)
No further symptoms. Catheterization did not reveal obstructive coronary disease. If symptoms recur she will followup with primary care for evaluation of noncardiac causes of chest pain.

## 2012-08-05 NOTE — Assessment & Plan Note (Signed)
followup primary care. 

## 2012-08-05 NOTE — Assessment & Plan Note (Signed)
Continue Toprol. Medicines can be adjusted as an outpatient based on followup readings.

## 2012-10-12 ENCOUNTER — Other Ambulatory Visit: Payer: Self-pay

## 2012-10-13 ENCOUNTER — Other Ambulatory Visit: Payer: Self-pay

## 2012-10-13 DIAGNOSIS — Z1231 Encounter for screening mammogram for malignant neoplasm of breast: Secondary | ICD-10-CM

## 2012-11-01 ENCOUNTER — Ambulatory Visit: Payer: Medicare Other

## 2012-11-08 ENCOUNTER — Ambulatory Visit
Admission: RE | Admit: 2012-11-08 | Discharge: 2012-11-08 | Disposition: A | Discharge status: Home / Self Care | Payer: Medicare Other | Source: Ambulatory Visit

## 2012-11-08 DIAGNOSIS — Z1231 Encounter for screening mammogram for malignant neoplasm of breast: Secondary | ICD-10-CM

## 2013-01-12 ENCOUNTER — Other Ambulatory Visit: Payer: Self-pay

## 2013-01-25 ENCOUNTER — Institutional Professional Consult (permissible substitution): Payer: Medicare Other | Admitting: Pulmonary Disease

## 2013-12-22 ENCOUNTER — Other Ambulatory Visit: Payer: Self-pay

## 2013-12-29 ENCOUNTER — Ambulatory Visit: Payer: Medicare Other | Attending: Orthopaedic Surgery | Admitting: Physical Therapy

## 2013-12-29 DIAGNOSIS — Z5189 Encounter for other specified aftercare: Secondary | ICD-10-CM | POA: Insufficient documentation

## 2013-12-29 DIAGNOSIS — M47896 Other spondylosis, lumbar region: Secondary | ICD-10-CM | POA: Insufficient documentation

## 2014-01-02 ENCOUNTER — Ambulatory Visit: Payer: Medicare Other | Admitting: *Deleted

## 2014-01-02 ENCOUNTER — Ambulatory Visit: Payer: Medicare Other | Admitting: Physical Therapy

## 2014-01-02 DIAGNOSIS — Z5189 Encounter for other specified aftercare: Secondary | ICD-10-CM | POA: Diagnosis not present

## 2014-01-03 ENCOUNTER — Ambulatory Visit: Payer: Medicare Other | Admitting: Physical Therapy

## 2014-01-03 DIAGNOSIS — Z5189 Encounter for other specified aftercare: Secondary | ICD-10-CM | POA: Diagnosis not present

## 2014-01-04 ENCOUNTER — Ambulatory Visit: Payer: Medicare Other | Admitting: Physical Therapy

## 2014-01-04 DIAGNOSIS — Z5189 Encounter for other specified aftercare: Secondary | ICD-10-CM | POA: Diagnosis not present

## 2014-01-08 ENCOUNTER — Encounter: Payer: Medicare Other | Admitting: Physical Therapy

## 2014-01-09 ENCOUNTER — Ambulatory Visit: Payer: Medicare Other | Attending: Orthopaedic Surgery | Admitting: Physical Therapy

## 2014-01-09 DIAGNOSIS — M47896 Other spondylosis, lumbar region: Secondary | ICD-10-CM | POA: Insufficient documentation

## 2014-01-09 DIAGNOSIS — Z5189 Encounter for other specified aftercare: Secondary | ICD-10-CM | POA: Diagnosis present

## 2014-01-11 ENCOUNTER — Ambulatory Visit: Payer: Medicare Other | Admitting: Physical Therapy

## 2014-01-11 DIAGNOSIS — Z5189 Encounter for other specified aftercare: Secondary | ICD-10-CM | POA: Diagnosis not present

## 2014-01-15 ENCOUNTER — Ambulatory Visit: Payer: Medicare Other | Admitting: Physical Therapy

## 2014-01-15 DIAGNOSIS — Z5189 Encounter for other specified aftercare: Secondary | ICD-10-CM | POA: Diagnosis not present

## 2014-01-16 ENCOUNTER — Encounter: Payer: Medicare Other | Admitting: Physical Therapy

## 2014-01-16 ENCOUNTER — Other Ambulatory Visit: Payer: Self-pay | Admitting: Orthopaedic Surgery

## 2014-01-16 DIAGNOSIS — M5416 Radiculopathy, lumbar region: Secondary | ICD-10-CM

## 2014-01-18 ENCOUNTER — Encounter: Payer: Medicare Other | Admitting: Physical Therapy

## 2014-01-19 ENCOUNTER — Ambulatory Visit: Payer: Medicare Other | Admitting: *Deleted

## 2014-01-19 DIAGNOSIS — Z5189 Encounter for other specified aftercare: Secondary | ICD-10-CM | POA: Diagnosis not present

## 2014-01-23 ENCOUNTER — Ambulatory Visit: Payer: Medicare Other | Admitting: Physical Therapy

## 2014-01-23 DIAGNOSIS — Z5189 Encounter for other specified aftercare: Secondary | ICD-10-CM | POA: Diagnosis not present

## 2014-01-24 ENCOUNTER — Ambulatory Visit
Admission: RE | Admit: 2014-01-24 | Discharge: 2014-01-24 | Disposition: A | Discharge status: Home / Self Care | Payer: Medicare Other | Source: Ambulatory Visit | Attending: Orthopaedic Surgery | Admitting: Orthopaedic Surgery

## 2014-01-24 ENCOUNTER — Encounter: Payer: Medicare Other | Admitting: Physical Therapy

## 2014-01-24 DIAGNOSIS — M5416 Radiculopathy, lumbar region: Secondary | ICD-10-CM

## 2014-01-25 ENCOUNTER — Other Ambulatory Visit: Payer: Medicare Other

## 2014-01-26 ENCOUNTER — Ambulatory Visit: Payer: Medicare Other | Admitting: Physical Therapy

## 2014-01-29 ENCOUNTER — Encounter: Payer: Medicare Other | Admitting: Physical Therapy

## 2014-01-31 ENCOUNTER — Encounter: Payer: Medicare Other | Admitting: Physical Therapy

## 2014-02-15 ENCOUNTER — Encounter (HOSPITAL_COMMUNITY): Payer: Self-pay | Admitting: Cardiovascular Disease

## 2014-02-27 ENCOUNTER — Other Ambulatory Visit: Payer: Self-pay | Admitting: Rehabilitation

## 2014-02-27 DIAGNOSIS — M545 Low back pain: Secondary | ICD-10-CM

## 2014-03-08 ENCOUNTER — Ambulatory Visit
Admission: RE | Admit: 2014-03-08 | Discharge: 2014-03-08 | Disposition: A | Discharge status: Home / Self Care | Payer: Medicare Other | Source: Ambulatory Visit | Attending: Rehabilitation | Admitting: Rehabilitation

## 2014-03-08 DIAGNOSIS — M545 Low back pain: Secondary | ICD-10-CM

## 2014-09-03 ENCOUNTER — Other Ambulatory Visit: Payer: Self-pay

## 2014-09-03 DIAGNOSIS — Z1231 Encounter for screening mammogram for malignant neoplasm of breast: Secondary | ICD-10-CM

## 2014-10-04 ENCOUNTER — Ambulatory Visit
Admission: RE | Admit: 2014-10-04 | Discharge: 2014-10-04 | Disposition: A | Discharge status: Home / Self Care | Payer: Medicare Other | Source: Ambulatory Visit

## 2014-10-04 DIAGNOSIS — Z1231 Encounter for screening mammogram for malignant neoplasm of breast: Secondary | ICD-10-CM

## 2015-04-16 ENCOUNTER — Ambulatory Visit: Payer: Medicare Other | Attending: Student | Admitting: Physical Therapy

## 2015-04-16 DIAGNOSIS — M79672 Pain in left foot: Secondary | ICD-10-CM | POA: Diagnosis present

## 2015-04-16 NOTE — Therapy (Signed)
[facility] [place], [place], [address] Phone: [phone]   Fax:  [phone]  Physical Therapy Evaluation  Patient Details  Name: Jenny Martinez MRN: [id number] Date of Birth: [birth year] Referring Provider: Mechele Claude PA-C  Encounter Date: 04/16/2015      PT End of Session - 04/16/15 1548    Visit Number 1   Number of Visits 12   Date for PT Re-Evaluation 06/04/15   PT Start Time 0100   PT Stop Time 0150   PT Time Calculation (min) 50 min   Activity Tolerance Patient tolerated treatment well   Behavior During Therapy [facility] for tasks assessed/performed      Past Medical History  Diagnosis Date  . Hypertension   . PONV (postoperative nausea and vomiting)   . High cholesterol   . Daily headache   . Migraines     "much better since I went on the BP medicine" (07/21/2012)  . Arthritis     "spine; neck thru lower back" (07/21/2012)  . Chronic lower back pain   . Anxiety   . H/O cardiac catheterization     No angiographic evidence of CAD 07/2012  . Elevated TSH     a. Noted 07/2012, to f/u pcp.    Past Surgical History  Procedure Laterality Date  . Abdominal hysterectomy  1980's  . Anterior cervical decomp/discectomy fusion  2002?  Marland Kitchen Cholecystectomy  2006?  Marland Kitchen Posterior lumbar fusion  09/2010  . Bilateral oophorectomy  1980's    "2 years after hysterectomy" (07/21/12)  . Tonsillectomy  1954  . Tubal ligation  1980  . Appendectomy  1980's    "w/hysterectomy" (07/21/2012)  . Left heart catheterization with coronary angiogram N/A 07/22/2012    Procedure: LEFT HEART CATHETERIZATION WITH CORONARY ANGIOGRAM;  Surgeon: Burnell Blanks, MD;  Location[facility] CATH LAB;  Service: Cardiovascular;  Laterality: N/A;    There were no vitals filed for this visit.  Visit Diagnosis:  Left foot pain - Plan: PT plan of care cert/re-cert      Subjective Assessment - 04/16/15 1551    Subjective Foot was numb butnow it  hurts.   Limitations Walking   How long can you walk comfortably? 10 minutes.   Patient Stated Goals Walk without pain.   Currently in Pain? Yes   Pain Score 4    Pain Location Ankle   Pain Orientation Left   Pain Descriptors / Indicators Aching;Throbbing   Pain Type Chronic pain   Pain Onset More than a month ago   Pain Frequency Constant   Aggravating Factors  Walking.   Pain Relieving Factors Rest.            [facility] PT Assessment - 04/16/15 0001    Assessment   Medical Diagnosis Left post tib tendonitis.   Referring Provider Mechele Claude PA-C   Onset Date/Surgical Date --  8 months.   Precautions   Precautions None   Restrictions   Weight Bearing Restrictions Yes   Balance Screen   Has the patient fallen in the past 6 months No   Has the patient had a decrease in activity level because of a fear of falling?  No   Is the patient reluctant to leave their home because of a fear of falling?  No   Home Environment   Living Environment Private residence   Prior Function   Level of Independence Independent   Observation/Other Assessments   Observations Essentially normal foot posture  ROM / Strength   AROM / PROM / Strength AROM   AROM   Overall AROM Comments Full active left ankle ROM.   Palpation   Palpation comment Pain reported deep in left distal calf and along tib post tendon around left medial malleoli and in area of navicular tendon.   Ambulation/Gait   Gait Pattern Antalgic                   [facility] Adult PT Treatment/Exercise - 05/15/15 0001    Modalities   Modalities Electrical Stimulation;Moist Heat   Moist Heat Therapy   Number Minutes Moist Heat 15 Minutes   Moist Heat Location --  Left medial ankle.   Acupuncturist Location Left medial malleolar region.(post tib.).   Electrical Stimulation Action Constant pre-mod e' stim   Electrical Stimulation Parameters 80-150 HZ x 15 minutes                   PT Short Term Goals - 2015/05/15 1609    PT SHORT TERM GOAL #1   Title Ind with a HEP.   Time 2   Period Weeks   Status New           PT Long Term Goals - 05-15-15 1610    PT LONG TERM GOAL #1   Title Walk a community distance wiht pain not > 3/10.   Time 4   Period Weeks   Status New   PT LONG TERM GOAL #2   Title Perform ADL's with pain not > 3/10.               Plan - 2015-05-15 1554    Clinical Impression Statement The patient reports her left foot and ankle began to feel numb approximately 8 months ago.  She said she could live with this but then the inside of her left ankle began to hurt to levels reaching 8/10.  Pain increases with increased walking. Ice helps decrease her pain. I recommended she also go to a specialty running shop for a free video assessment  of her gait pattern and possible get some inserts to offload her post tib tendon.   Pt will benefit from skilled therapeutic intervention in order to improve on the following deficits Pain;Decreased activity tolerance   Rehab Potential Excellent   PT Frequency 3x / week   PT Duration 4 weeks   PT Treatment/Interventions Electrical Stimulation;Moist Heat;Therapeutic exercise;Therapeutic activities;Ultrasound;Patient/family education;Manual techniques   PT Next Visit Plan Left post tib strengthening; IASTM to left calf and STW/M over distal tendon region; Small soundhead Combo e'stim/U/S at 3.3 MHz; electrical stimulation.   Consulted and Agree with Plan of Care Patient          G-Codes - May 15, 2015 1549    Functional Assessment Tool Used FOTO...Marland KitchenMarland Kitchen49% limitation.   Functional Limitation Mobility: Walking and moving around   Mobility: Walking and Moving Around Current Status [phone]) At least 40 percent but less than 60 percent impaired, limited or restricted       Problem List Patient Active Problem List   Diagnosis Date Noted  . Elevated TSH 07/22/2012  . Hypertension 07/21/2012  . Chronic back pain  07/21/2012  . Precordial pain 07/21/2012    Buddie Marston, [place] MPT 15-May-2015, 5:15 PM  [facility] [address] [place], [place], [address] Phone: [phone]   Fax:  [phone]  Name: Jenny Martinez MRN: [id number] Date of Birth: 1945-03-26

## 2015-04-18 ENCOUNTER — Ambulatory Visit: Payer: Medicare Other | Admitting: *Deleted

## 2015-04-18 DIAGNOSIS — M79672 Pain in left foot: Secondary | ICD-10-CM | POA: Diagnosis not present

## 2015-04-18 NOTE — Therapy (Signed)
[facility] [place], [place], [address] Phone: [phone]   Fax:  [phone]  Physical Therapy Treatment  Patient Details  Name: Jenny Martinez MRN: [id number] Date of Birth: Feb 09, 1946 Referring Provider: Mechele Claude PA-C  Encounter Date: 04/18/2015      PT End of Session - 04/18/15 1436    Visit Number 2   Number of Visits 12   Date for PT Re-Evaluation 06/04/15   PT Start Time 1430   PT Stop Time 1520   PT Time Calculation (min) 50 min      Past Medical History  Diagnosis Date  . Hypertension   . PONV (postoperative nausea and vomiting)   . High cholesterol   . Daily headache   . Migraines     "much better since I went on the BP medicine" (07/21/2012)  . Arthritis     "spine; neck thru lower back" (07/21/2012)  . Chronic lower back pain   . Anxiety   . H/O cardiac catheterization     No angiographic evidence of CAD 07/2012  . Elevated TSH     a. Noted 07/2012, to f/u pcp.    Past Surgical History  Procedure Laterality Date  . Abdominal hysterectomy  1980's  . Anterior cervical decomp/discectomy fusion  2002?  Marland Kitchen Cholecystectomy  2006?  Marland Kitchen Posterior lumbar fusion  09/2010  . Bilateral oophorectomy  1980's    "2 years after hysterectomy" (07/21/12)  . Tonsillectomy  1954  . Tubal ligation  1980  . Appendectomy  1980's    "w/hysterectomy" (07/21/2012)  . Left heart catheterization with coronary angiogram N/A 07/22/2012    Procedure: LEFT HEART CATHETERIZATION WITH CORONARY ANGIOGRAM;  Surgeon: Burnell Blanks, MD;  Location: [facility] CATH LAB;  Service: Cardiovascular;  Laterality: N/A;    There were no vitals filed for this visit.  Visit Diagnosis:  Left foot pain      Subjective Assessment - 04/18/15 1435    Subjective Foot was numb butnow it hurts.   How long can you walk comfortably? 10 minutes.   Patient Stated Goals Walk without pain.   Currently in Pain? Yes   Pain Score 3    Pain  Location Ankle   Pain Orientation Left   Pain Descriptors / Indicators Aching;Throbbing   Pain Onset More than a month ago   Pain Frequency Constant   Aggravating Factors  walking                         [facility] Adult PT Treatment/Exercise - 04/18/15 0001    Modalities   Modalities Electrical Stimulation;Moist Heat;Ultrasound   Moist Heat Therapy   Number Minutes Moist Heat 15 Minutes   Moist Heat Location Ankle  foot   Electrical Stimulation   Electrical Stimulation Location Left medial malleolar region.(post tib.). Premod 80-120hz  x 15 mins   Electrical Stimulation Goals Pain   Ultrasound   Ultrasound Location LT foot along posterior tibialis insertion   Ultrasound Parameters 1.5 w/cm2 x 10 mins   Ultrasound Goals Pain   Manual Therapy   Manual Therapy Soft tissue mobilization   Soft tissue mobilization STW to posterior tibialis tendon and into musscle belly, soleus medial aspect                  PT Short Term Goals - 04/16/15 1609    PT SHORT TERM GOAL #1   Title Ind with a HEP.   Time 2  Period Weeks   Status New           PT Long Term Goals - 04/16/15 1610    PT LONG TERM GOAL #1   Title Walk a community distance wiht pain not > 3/10.   Time 4   Period Weeks   Status New   PT LONG TERM GOAL #2   Title Perform ADL's with pain not > 3/10.               Plan - 04/18/15 1437    Clinical Impression Statement Pt did fairly well today and was able to tolerate STW and [place] to psoserior tib. She had more soreness in the distal aspect and insertion. normal modality response and had less pain after RX   Pt will benefit from skilled therapeutic intervention in order to improve on the following deficits Pain;Decreased activity tolerance   Rehab Potential Excellent   PT Frequency 3x / week   PT Duration 4 weeks   PT Treatment/Interventions Electrical Stimulation;Moist Heat;Therapeutic exercise;Therapeutic  activities;Ultrasound;Patient/family education;Manual techniques   PT Next Visit Plan Left post tib strengthening; IASTM to left calf and STW/M over distal tendon region; Small soundhead Combo e'stim/U/S at 3.3 MHz; electrical stimulation.   Consulted and Agree with Plan of Care Patient        Problem List Patient Active Problem List   Diagnosis Date Noted  . Elevated TSH 07/22/2012  . Hypertension 07/21/2012  . Chronic back pain 07/21/2012  . Precordial pain 07/21/2012    Zylan Almquist,CHRIS , PTA  04/18/2015, 3:37 PM  [facility] [place], [place], [address] Phone: [phone]   Fax:  [phone]  Name: Jenny Martinez MRN: [id number] Date of Birth: 1945-11-12

## 2015-04-19 ENCOUNTER — Encounter: Payer: Medicare Other | Admitting: *Deleted

## 2015-04-22 ENCOUNTER — Ambulatory Visit: Payer: Medicare Other | Admitting: *Deleted

## 2015-04-22 DIAGNOSIS — M79672 Pain in left foot: Secondary | ICD-10-CM | POA: Diagnosis not present

## 2015-04-22 NOTE — Therapy (Signed)
[facility] [place], [place], [address] Phone: [phone]   Fax:  [phone]  Physical Therapy Treatment  Patient Details  Name: Jenny Martinez MRN: [id number] Date of Birth: Dec 31, 1945 Referring Provider: Mechele Claude PA-C  Encounter Date: 04/22/2015      PT End of Session - 04/22/15 1304    Visit Number 3   Number of Visits 12   Date for PT Re-Evaluation 06/04/15   PT Start Time 1300   PT Stop Time 1351   PT Time Calculation (min) 51 min      Past Medical History  Diagnosis Date  . Hypertension   . PONV (postoperative nausea and vomiting)   . High cholesterol   . Daily headache   . Migraines     "much better since I went on the BP medicine" (07/21/2012)  . Arthritis     "spine; neck thru lower back" (07/21/2012)  . Chronic lower back pain   . Anxiety   . H/O cardiac catheterization     No angiographic evidence of CAD 07/2012  . Elevated TSH     a. Noted 07/2012, to f/u pcp.    Past Surgical History  Procedure Laterality Date  . Abdominal hysterectomy  1980's  . Anterior cervical decomp/discectomy fusion  2002?  Marland Kitchen Cholecystectomy  2006?  Marland Kitchen Posterior lumbar fusion  09/2010  . Bilateral oophorectomy  1980's    "2 years after hysterectomy" (07/21/12)  . Tonsillectomy  1954  . Tubal ligation  1980  . Appendectomy  1980's    "w/hysterectomy" (07/21/2012)  . Left heart catheterization with coronary angiogram N/A 07/22/2012    Procedure: LEFT HEART CATHETERIZATION WITH CORONARY ANGIOGRAM;  Surgeon: Burnell Blanks, MD;  Location: [facility] C[facility] LAB;  Service: Cardiovascular;  Laterality: N/A;    There were no vitals filed for this visit.  Visit Diagnosis:  Left foot pain      Subjective Assessment - 04/22/15 1258    Subjective LT foot is doing better with less pain   Limitations Walking   How long can you walk comfortably? 10 minutes.   Patient Stated Goals Walk without pain.   Pain Score 3    Pain  Location Ankle   Pain Orientation Left   Pain Descriptors / Indicators Aching;Throbbing   Pain Type Chronic pain   Pain Onset More than a month ago   Pain Frequency Constant                         [facility] Adult PT Treatment/Exercise - 04/22/15 0001    Modalities   Modalities Electrical Stimulation;Moist Heat;Ultrasound   Moist Heat Therapy   Number Minutes Moist Heat 15 Minutes   Moist Heat Location Ankle   Electrical Stimulation   Electrical Stimulation Location Left medial malleolar region.(post tib.). Premod 80-120hz  x 15 mins   Electrical Stimulation Goals Pain   Ultrasound   Ultrasound Location LT foot Psost. Tib tendon   Ultrasound Parameters 1.5 w/cm2 x10 mins   Ultrasound Goals Pain   Manual Therapy   Manual Therapy Soft tissue mobilization   Soft tissue mobilization STW to posterior tibialis tendon and into musscle belly, soleus medial aspect                  PT Short Term Goals - 04/16/15 1609    PT SHORT TERM GOAL #1   Title Ind with a HEP.   Time 2   Period Weeks  Status New           PT Long Term Goals - 04/16/15 1610    PT LONG TERM GOAL #1   Title Walk a community distance wiht pain not > 3/10.   Time 4   Period Weeks   Status New   PT LONG TERM GOAL #2   Title Perform ADL's with pain not > 3/10.               Plan - 04/22/15 1351    Clinical Impression Statement Pt did great with Rx today and was able to tolerate STW better with less pain and tenderness along posterior Tib. She has an orthotic in her shoe now to help with arch support. Normal modality response after removal. Goals are ongoing at this time   Pt will benefit from skilled therapeutic intervention in order to improve on the following deficits Pain;Decreased activity tolerance   Rehab Potential Excellent   PT Frequency 3x / week   PT Duration 4 weeks   PT Treatment/Interventions Electrical Stimulation;Moist Heat;Therapeutic exercise;Therapeutic  activities;Ultrasound;Patient/family education;Manual techniques   PT Next Visit Plan Left post tib strengthening; IASTM to left calf and STW/M over distal tendon region; Small soundhead Combo e'stim/U/S at 3.3 MHz; electrical stimulation.   Consulted and Agree with Plan of Care Patient        Problem List Patient Active Problem List   Diagnosis Date Noted  . Elevated TSH 07/22/2012  . Hypertension 07/21/2012  . Chronic back pain 07/21/2012  . Precordial pain 07/21/2012    Hertha Gergen,CHRIS, PTA 04/22/2015, 1:55 PM  [facility] [place], [place], [address] Phone: [phone]   Fax:  [phone]  Name: Jenny Martinez MRN: [id number] Date of Birth: 03-27-1945

## 2015-04-23 ENCOUNTER — Encounter: Payer: Medicare Other | Admitting: *Deleted

## 2015-04-25 ENCOUNTER — Ambulatory Visit: Payer: Medicare Other | Admitting: Physical Therapy

## 2015-04-25 ENCOUNTER — Encounter: Payer: Self-pay | Admitting: Physical Therapy

## 2015-04-25 DIAGNOSIS — M79672 Pain in left foot: Secondary | ICD-10-CM

## 2015-04-25 NOTE — Therapy (Signed)
[facility] [place], [place], [address] Phone: [phone]   Fax:  [phone]  Physical Therapy Treatment  Patient Details  Name: Jenny Martinez MRN: [id number] Date of Birth: [birth year] Referring Provider: Mechele Claude PA-C  Encounter Date: 04/25/2015      PT End of Session - 04/25/15 1440    Visit Number 4   Number of Visits 12   Date for PT Re-Evaluation 06/04/15   PT Start Time 1440   PT Stop Time 1521   PT Time Calculation (min) 41 min      Past Medical History  Diagnosis Date  . Hypertension   . PONV (postoperative nausea and vomiting)   . High cholesterol   . Daily headache   . Migraines     "much better since I went on the BP medicine" (07/21/2012)  . Arthritis     "spine; neck thru lower back" (07/21/2012)  . Chronic lower back pain   . Anxiety   . H/O cardiac catheterization     No angiographic evidence of CAD 07/2012  . Elevated TSH     a. Noted 07/2012, to f/u pcp.    Past Surgical History  Procedure Laterality Date  . Abdominal hysterectomy  1980's  . Anterior cervical decomp/discectomy fusion  2002?  Marland Kitchen Cholecystectomy  2006?  Marland Kitchen Posterior lumbar fusion  09/2010  . Bilateral oophorectomy  1980's    "2 years after hysterectomy" (07/21/12)  . Tonsillectomy  1954  . Tubal ligation  1980  . Appendectomy  1980's    "w/hysterectomy" (07/21/2012)  . Left heart catheterization with coronary angiogram N/A 07/22/2012    Procedure: LEFT HEART CATHETERIZATION WITH CORONARY ANGIOGRAM;  Surgeon: Burnell Blanks, MD;  Location: [facility] CATH LAB;  Service: Cardiovascular;  Laterality: N/A;    There were no vitals filed for this visit.  Visit Diagnosis:  Left foot pain      Subjective Assessment - 04/25/15 1439    Subjective 30% better per patient report. Reports that L foot aching and throbbing has improved by decreasing.   Limitations Walking   How long can you walk comfortably? 10 minutes.   Patient Stated Goals Walk without pain.   Currently in Pain? Yes   Pain Score 2    Pain Location Ankle   Pain Orientation Left;Medial   Pain Type Chronic pain   Pain Onset More than a month ago                         [facility] Adult PT Treatment/Exercise - 04/25/15 0001    Modalities   Modalities Electrical Stimulation;Moist Heat;Ultrasound   Moist Heat Therapy   Number Minutes Moist Heat 15 Minutes   Moist Heat Location Ankle   Electrical Stimulation   Electrical Stimulation Location L medial ankle and medial calf   Electrical Stimulation Action Pre-Mod   Electrical Stimulation Parameters 80-150 Hz x15 min   Electrical Stimulation Goals Pain   Ultrasound   Ultrasound Location L medial foot just inferior to medial malleolus   Ultrasound Parameters 1.0 w/cm2, 100%, 3.3 mhz x10 min   Ultrasound Goals Pain   Manual Therapy   Manual Therapy Soft tissue mobilization   Soft tissue mobilization STW to posterior tibialis tendon and into musscle belly, soleus medial aspect                  PT Short Term Goals - 04/16/15 1609    PT SHORT TERM  GOAL #1   Title Ind with a HEP.   Time 2   Period Weeks   Status New           PT Long Term Goals - 04/16/15 1610    PT LONG TERM GOAL #1   Title Walk a community distance wiht pain not > 3/10.   Time 4   Period Weeks   Status New   PT LONG TERM GOAL #2   Title Perform ADL's with pain not > 3/10.               Plan - 04/25/15 1514    Clinical Impression Statement Patient tolerated today's treatment well with no reports of tenderness or pain with treatment today. Tightness continues to be noted in L Posterior Tibialis muscle and most predominate as it blends with calf musculature. Patient continues to wear arch support in shoes and has not been able to go to [employer] yet as of today. Normal modalities response noted following removal of the modalities. Goals continues to be on-going at this time  secondary pain with ambulation and ADLs if she is on her feet for prolonged period. Patient denied L foot pain following today's treatment.   Pt will benefit from skilled therapeutic intervention in order to improve on the following deficits Pain;Decreased activity tolerance   Rehab Potential Excellent   PT Frequency 3x / week   PT Duration 4 weeks   PT Treatment/Interventions Electrical Stimulation;Moist Heat;Therapeutic exercise;Therapeutic activities;Ultrasound;Patient/family education;Manual techniques   PT Next Visit Plan Continue with modalites and manual therapy as symptoms dictate and then progress to strengthening per MPT POC.   Consulted and Agree with Plan of Care Patient        Problem List Patient Active Problem List   Diagnosis Date Noted  . Elevated TSH 07/22/2012  . Hypertension 07/21/2012  . Chronic back pain 07/21/2012  . Precordial pain 07/21/2012    Wynelle Fanny, PTA 04/25/2015, 3:24 PM  [facility] [address] [place], [place], [address] Phone: [phone]   Fax:  [phone]  Name: CARAH ARNWINE MRN: [id number] Date of Birth: 1945-03-23

## 2015-04-26 ENCOUNTER — Encounter: Payer: Medicare Other | Admitting: Physical Therapy

## 2015-04-29 ENCOUNTER — Ambulatory Visit: Payer: Medicare Other | Admitting: Physical Therapy

## 2015-04-29 DIAGNOSIS — M79672 Pain in left foot: Secondary | ICD-10-CM

## 2015-04-29 NOTE — Therapy (Signed)
[facility] [place], [place], [address] Phone: [phone]   Fax:  [phone]  Physical Therapy Treatment  Patient Details  Name: Jenny Martinez MRN: [id number] Date of Birth: May 03, 1945 Referring Provider: Mechele Claude PA-C  Encounter Date: 04/29/2015      PT End of Session - 04/29/15 1730    Visit Number 5   Number of Visits 12   Date for PT Re-Evaluation 06/04/15   PT Start Time 0100   PT Stop Time 0152   PT Time Calculation (min) 52 min   Activity Tolerance Patient tolerated treatment well   Behavior During Therapy [facility] for tasks assessed/performed      Past Medical History  Diagnosis Date  . Hypertension   . PONV (postoperative nausea and vomiting)   . High cholesterol   . Daily headache   . Migraines     "much better since I went on the BP medicine" (07/21/2012)  . Arthritis     "spine; neck thru lower back" (07/21/2012)  . Chronic lower back pain   . Anxiety   . H/O cardiac catheterization     No angiographic evidence of CAD 07/2012  . Elevated TSH     a. Noted 07/2012, to f/u pcp.    Past Surgical History  Procedure Laterality Date  . Abdominal hysterectomy  1980's  . Anterior cervical decomp/discectomy fusion  2002?  Marland Kitchen Cholecystectomy  2006?  Marland Kitchen Posterior lumbar fusion  09/2010  . Bilateral oophorectomy  1980's    "2 years after hysterectomy" (07/21/12)  . Tonsillectomy  1954  . Tubal ligation  1980  . Appendectomy  1980's    "w/hysterectomy" (07/21/2012)  . Left heart catheterization with coronary angiogram N/A 07/22/2012    Procedure: LEFT HEART CATHETERIZATION WITH CORONARY ANGIOGRAM;  Surgeon: Burnell Blanks, MD;  Location: [facility] CATH LAB;  Service: Cardiovascular;  Laterality: N/A;    There were no vitals filed for this visit.  Visit Diagnosis:  Left foot pain      Subjective Assessment - 04/29/15 1743    Subjective It's better.   Pain Location Ankle   Pain Orientation  Left;Medial   Pain Type Chronic pain   Pain Onset More than a month ago                                   PT Short Term Goals - 04/16/15 1609    PT SHORT TERM GOAL #1   Title Ind with a HEP.   Time 2   Period Weeks   Status New           PT Long Term Goals - 04/16/15 1610    PT LONG TERM GOAL #1   Title Walk a community distance wiht pain not > 3/10.   Time 4   Period Weeks   Status New   PT LONG TERM GOAL #2   Title Perform ADL's with pain not > 3/10.               Problem List Patient Active Problem List   Diagnosis Date Noted  . Elevated TSH 07/22/2012  . Hypertension 07/21/2012  . Chronic back pain 07/21/2012  . Precordial pain 07/21/2012   Moist heat and constant pre-mod e'stim x 15 minutes to patient's affected left tib ant f/b U/S at 1.50 W/CM2 x 12 minutes f/b STW/M x 18 minutes.  Patient felt better after treatment.  [name], [place] MPT 04/29/2015, 5:44 PM  [facility] [address] [place], [place], [address] Phone: [phone]   Fax:  [phone]  Name: Jenny Martinez MRN: [id number] Date of Birth: October 05, 1945

## 2015-05-02 ENCOUNTER — Ambulatory Visit: Payer: Medicare Other | Admitting: Physical Therapy

## 2015-05-02 ENCOUNTER — Encounter: Payer: Self-pay | Admitting: Physical Therapy

## 2015-05-02 DIAGNOSIS — M79672 Pain in left foot: Secondary | ICD-10-CM

## 2015-05-02 NOTE — Therapy (Addendum)
[facility] [place], [place], [address] Phone: [phone]   Fax:  [phone]  Physical Therapy Treatment  Patient Details  Name: Jenny Martinez MRN: [id number] Date of Birth: 1945/11/26 Referring Provider: Mechele Claude PA-C  Encounter Date: 05/02/2015      PT End of Session - 05/02/15 1433    Visit Number 6   Number of Visits 12   Date for PT Re-Evaluation 06/04/15   PT Start Time 7619   PT Stop Time 1517   PT Time Calculation (min) 44 min      Past Medical History  Diagnosis Date  . Hypertension   . PONV (postoperative nausea and vomiting)   . High cholesterol   . Daily headache   . Migraines     "much better since I went on the BP medicine" (07/21/2012)  . Arthritis     "spine; neck thru lower back" (07/21/2012)  . Chronic lower back pain   . Anxiety   . H/O cardiac catheterization     No angiographic evidence of CAD 07/2012  . Elevated TSH     a. Noted 07/2012, to f/u pcp.    Past Surgical History  Procedure Laterality Date  . Abdominal hysterectomy  1980's  . Anterior cervical decomp/discectomy fusion  2002?  Marland Kitchen Cholecystectomy  2006?  Marland Kitchen Posterior lumbar fusion  09/2010  . Bilateral oophorectomy  1980's    "2 years after hysterectomy" (07/21/12)  . Tonsillectomy  1954  . Tubal ligation  1980  . Appendectomy  1980's    "w/hysterectomy" (07/21/2012)  . Left heart catheterization with coronary angiogram N/A 07/22/2012    Procedure: LEFT HEART CATHETERIZATION WITH CORONARY ANGIOGRAM;  Surgeon: Burnell Blanks, MD;  Location: [facility] CATH LAB;  Service: Cardiovascular;  Laterality: N/A;    There were no vitals filed for this visit.  Visit Diagnosis:  Left foot pain      Subjective Assessment - 05/02/15 1432    Subjective Reports little pain with ambulation and states after 2 more treatments she will be done.   Limitations Walking   How long can you walk comfortably? 10 minutes.   Patient Stated  Goals Walk without pain.   Currently in Pain? Yes   Pain Score 1    Pain Location Ankle   Pain Orientation Left;Medial   Pain Descriptors / Indicators Dull   Pain Type Chronic pain   Pain Onset More than a month ago                         [facility] Adult PT Treatment/Exercise - 05/02/15 0001    Modalities   Modalities Electrical Stimulation;Moist Heat;Ultrasound   Moist Heat Therapy   Number Minutes Moist Heat 15 Minutes   Moist Heat Location Ankle   Electrical Stimulation   Electrical Stimulation Location L medial ankle   Electrical Stimulation Action Pre-Mod   Electrical Stimulation Parameters 80-150 Hz x15 min   Electrical Stimulation Goals Pain   Ultrasound   Ultrasound Location L medial foot   Ultrasound Parameters 1.0 w/cm2, 100%, 3.3 mhz x10 min   Ultrasound Goals Pain                  PT Short Term Goals - 04/16/15 1609    PT SHORT TERM GOAL #1   Title Ind with a HEP.   Time 2   Period Weeks   Status New  PT Long Term Goals - 04/16/15 1610    PT LONG TERM GOAL #1   Title Walk a community distance wiht pain not > 3/10.   Time 4   Period Weeks   Status New   PT LONG TERM GOAL #2   Title Perform ADL's with pain not > 3/10.               Plan - 05/02/15 1511    Clinical Impression Statement Patient tolerated today's treatment well with no reports of increased pain or soreness at any time during treatment. With patient reporting pain predominately in medial arch of L foot, treatment was focused in that region. Minimal tightness noted in L medial calf musculature today along the route of the Posterior Tibialis. Normal modalities response noted following removal of the modalities. Patient denied pain following today's treatment.   Pt will benefit from skilled therapeutic intervention in order to improve on the following deficits Pain;Decreased activity tolerance   Rehab Potential Excellent   PT Frequency 3x / week   PT  Duration 4 weeks   PT Treatment/Interventions Electrical Stimulation;Moist Heat;Therapeutic exercise;Therapeutic activities;Ultrasound;Patient/family education;Manual techniques   PT Next Visit Plan Continue with modalites and manual therapy as symptoms dictate and then progress to strengthening per MPT POC.   Consulted and Agree with Plan of Care Patient        Problem List Patient Active Problem List   Diagnosis Date Noted  . Elevated TSH 07/22/2012  . Hypertension 07/21/2012  . Chronic back pain 07/21/2012  . Precordial pain 07/21/2012    Wynelle Fanny, PTA 05/02/2015, 3:20 PM  [facility] [place], [place], [address] Phone: [phone]   Fax:  [phone]  Name: Jenny Martinez MRN: [id number] Date of Birth: Jan 13, 1946  PHYSICAL THERAPY DISCHARGE SUMMARY  Visits from Start of Care: 6.  Current functional level related to goals / functional outcomes: See above.   Remaining deficits: Minimal pain reported.   Education / Equipment: HEP. Plan: Patient agrees to discharge.  Patient goals were partially met. Patient is being discharged due to being pleased with the current functional level.  ?????         [place] Applegate MPT

## 2015-05-06 ENCOUNTER — Encounter: Payer: Medicare Other | Admitting: Physical Therapy

## 2015-05-09 ENCOUNTER — Encounter: Payer: Medicare Other | Admitting: *Deleted

## 2016-03-13 ENCOUNTER — Other Ambulatory Visit: Payer: Self-pay | Admitting: Family Medicine

## 2016-03-13 DIAGNOSIS — Z1231 Encounter for screening mammogram for malignant neoplasm of breast: Secondary | ICD-10-CM

## 2016-04-08 ENCOUNTER — Ambulatory Visit: Payer: Medicare Other

## 2016-04-13 ENCOUNTER — Ambulatory Visit: Payer: Medicare Other

## 2016-05-12 ENCOUNTER — Ambulatory Visit
Admission: RE | Admit: 2016-05-12 | Discharge: 2016-05-12 | Disposition: A | Discharge status: Home / Self Care | Payer: Medicare Other | Source: Ambulatory Visit | Attending: Family Medicine | Admitting: Family Medicine

## 2016-05-12 DIAGNOSIS — Z1231 Encounter for screening mammogram for malignant neoplasm of breast: Secondary | ICD-10-CM

## 2016-10-29 IMAGING — MR MR LUMBAR SPINE W/O CM
4 of 5 series · 26 of 48 positions shown · non-contrast
Comparison: 07/30/2010.

CLINICAL DATA: Low back pain. LEFT leg pain. Numbness in the LEFT
foot for 3-4 months. Subsequent encounter. Lifting injury several
months ago. Operation September 2010.

EXAM:
MRI LUMBAR SPINE WITHOUT CONTRAST
TECHNIQUE: Multiplanar, multisequence MR imaging of the lumbar spine was
performed. No intravenous contrast was administered.

[Series 2: T2 · sagittal · 4.0mm · 0.44mm/px · 6 of 14 slices shown (1 of 2)]
[im 1/14]
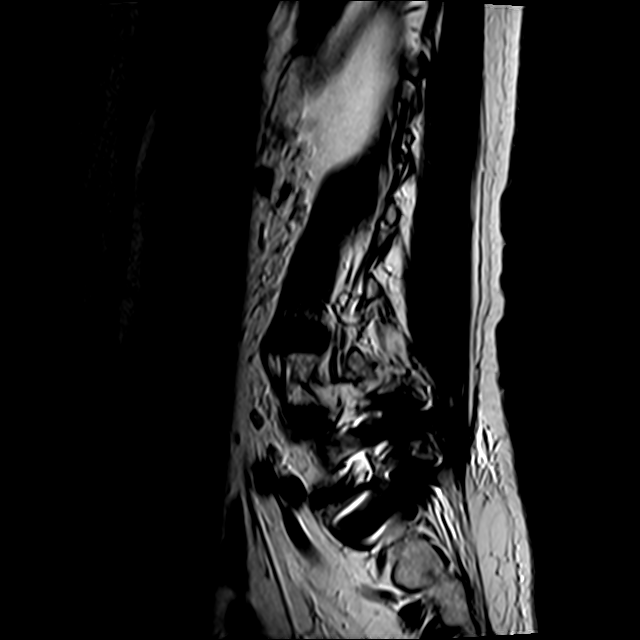
[im 3/14]
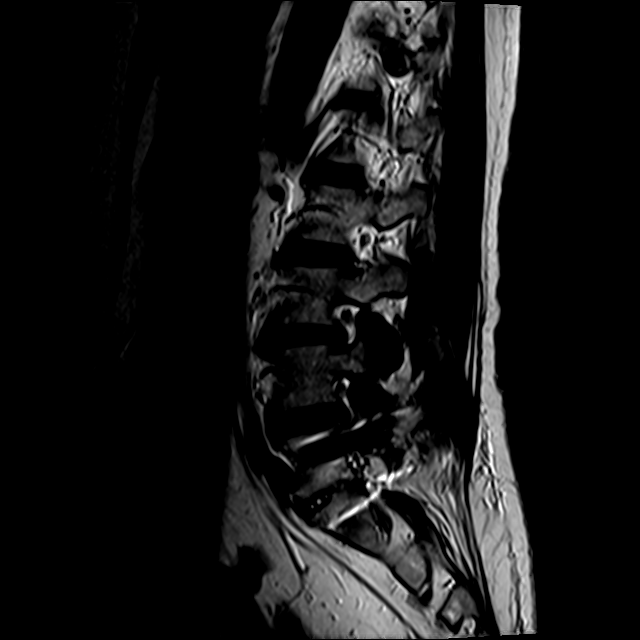
[im 6/14]
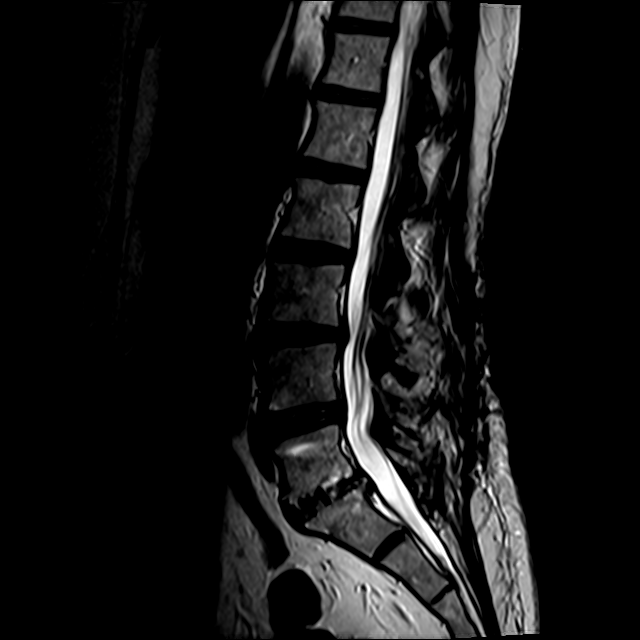
[im 8/14]
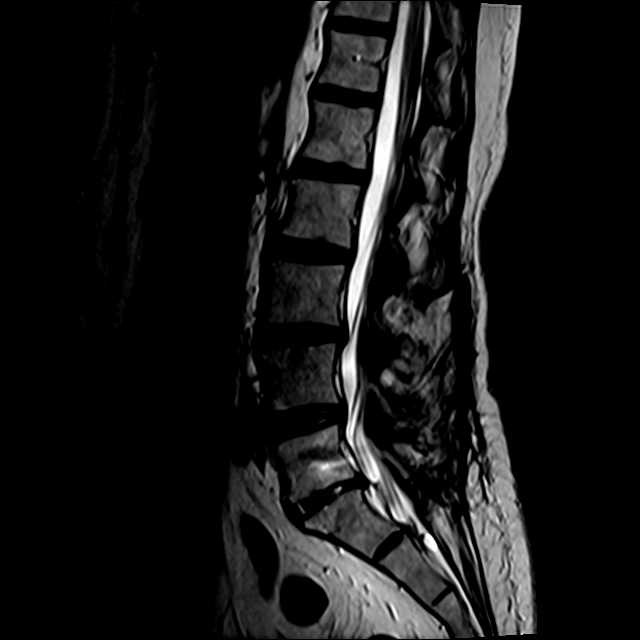
[im 11/14]
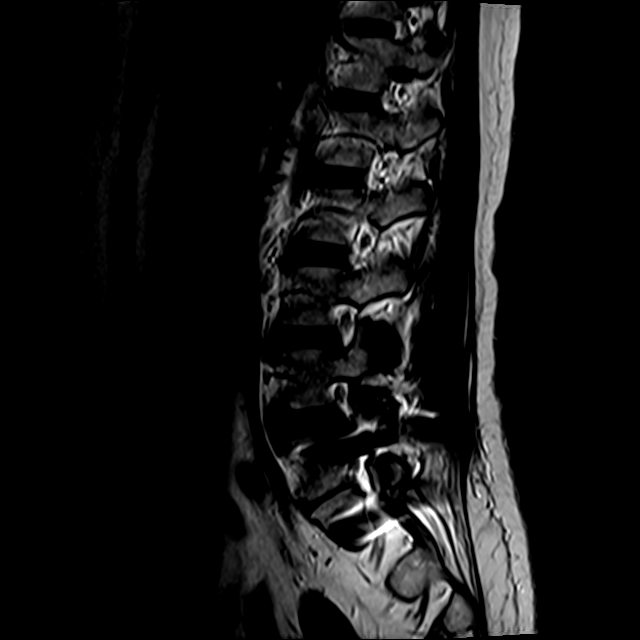
[im 14/14]
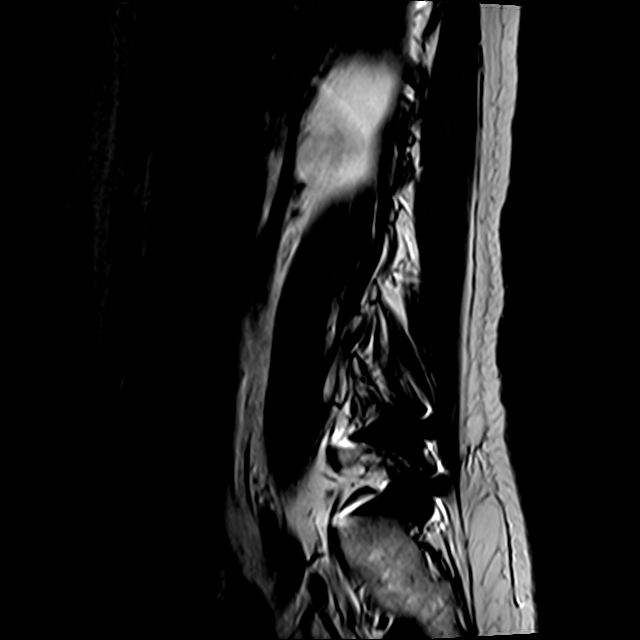

[Series 3: T1 · sagittal · 4.0mm · 0.55mm/px · 6 of 14 slices shown (1 of 2)]
[im 1/14]
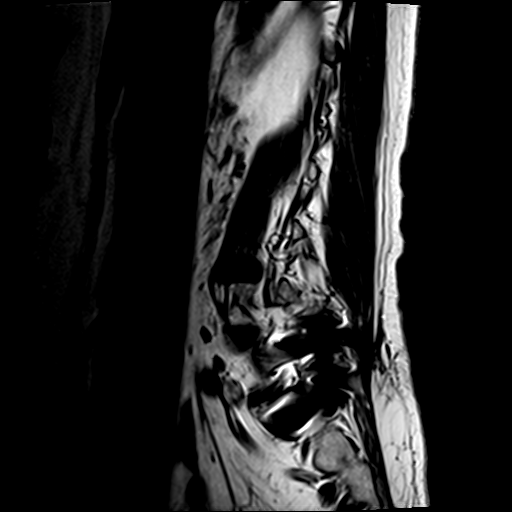
[im 3/14]
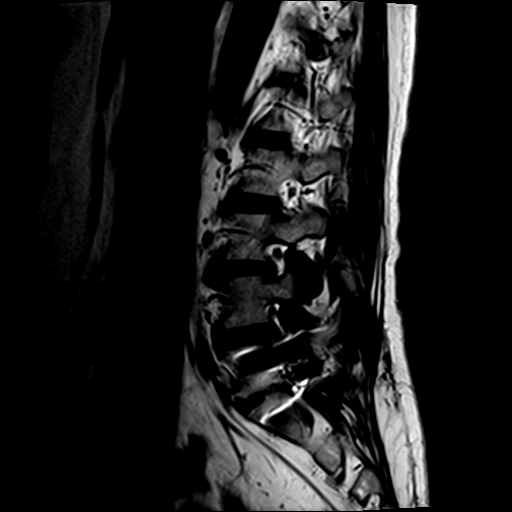
[im 6/14]
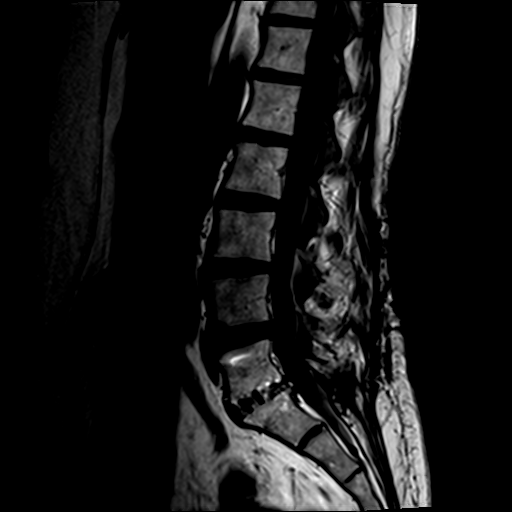
[im 8/14]
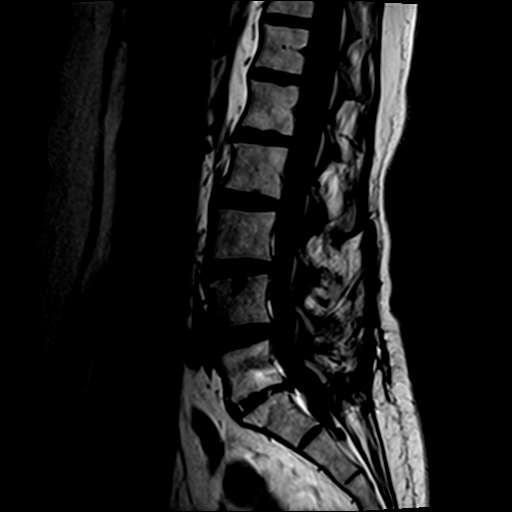
[im 11/14]
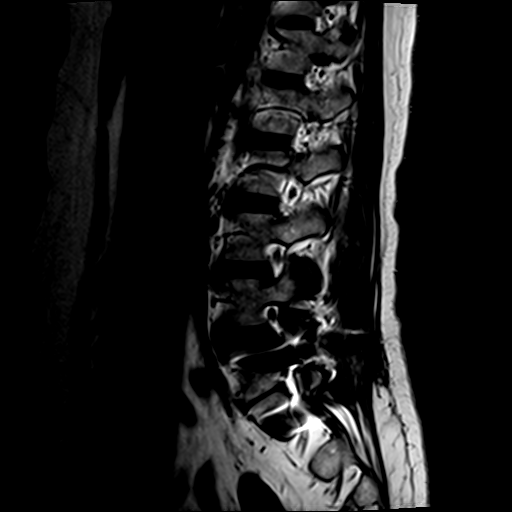
[im 14/14]
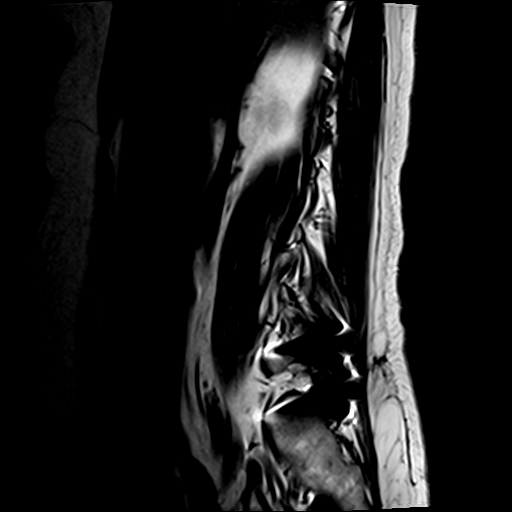

[Series 4: T2 · axial · 4.0mm · 0.74mm/px · z∈[-62,+116]mm · 9 of 37 slices shown (2 of 2)]
[im 1/37]
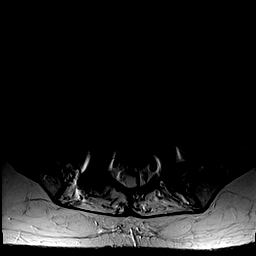
[im 6/37]
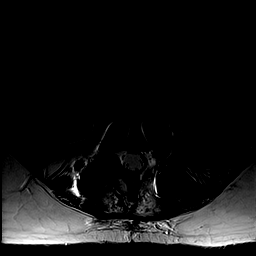
[im 11/37]
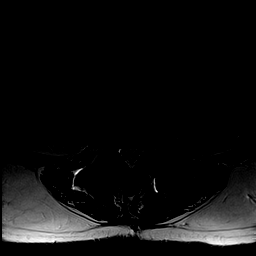
[im 16/37]
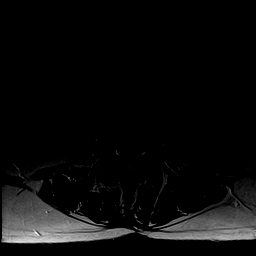
[im 19/37]
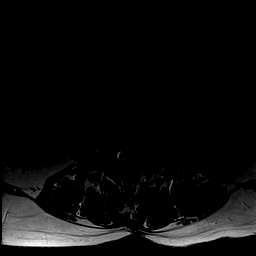
[im 21/37]
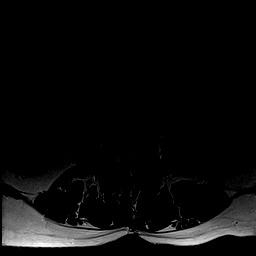
[im 26/37]
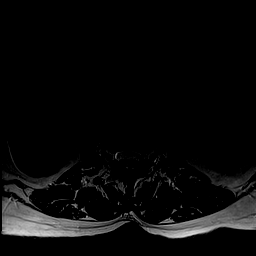
[im 31/37]
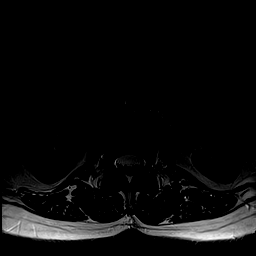
[im 37/37]
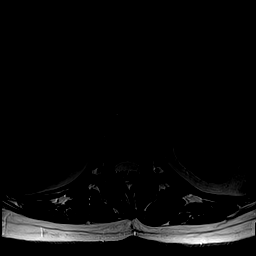

[Series 6: T1 · axial · 4.0mm · 0.74mm/px · z∈[-62,+89]mm · 5 of 37 slices shown (2 of 2)]
[im 1/37]
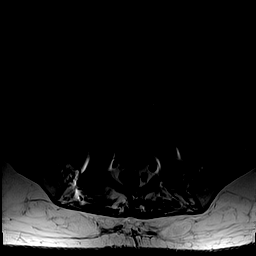
[im 6/37]
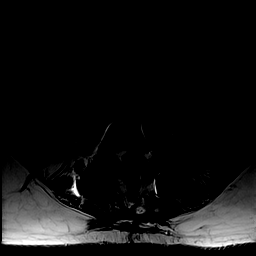
[im 11/37]
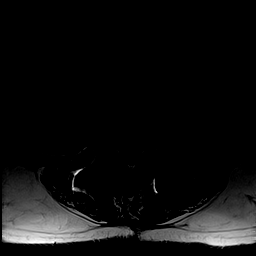
[im 19/37]
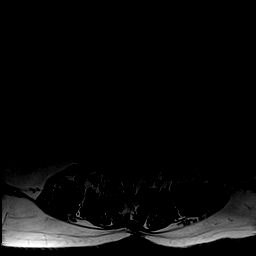
[im 31/37]
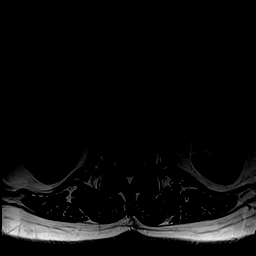

[26 of 48 positions shown; findings below may reference images not displayed]

FINDINGS: Segmentation: Numbering used on prior exam preserved.

Alignment:  Trace retrolisthesis of L4 on L5.  No change from prior.

Vertebrae: Normal vertebral body height. Prominent L4 Schmorl's node
is new compared to prior. New endplate edema in the anterior
inferior corner of L4. No destructive osseous lesions.

Conus medullaris: Normal at L1.

Paraspinal tissues: Renal cysts on the RIGHT.

Disc levels:

T11-T12 through L2-L3 discs appear normal.

L3-L4: Progressive disc desiccation. There is broad-based posterior
disc bulging, eccentric to the LEFT. LEFT facet arthrosis is
present. Bulging disc contacts the descending LEFT L4 nerve as it
approaches the lateral recess. The neural foramina appear adequately
patent. Mild loss of disc height.

L4-L5: Disc desiccation. No stenosis. Central canal, lateral
recesses and neural foramina appear patent.

L5-S1: LEFT laminotomy. Posterior lumbar interbody fusion with solid
fusion across the disc space.

Sacral marrow signal appears within normal limits.
IMPRESSION: 1. Uncomplicated L5-S1 posterior lumbar interbody fusion without
recurrent stenosis. Solid fusion.
2. Mild progression of L4-L5 disc degeneration without stenosis.
3. L3-L4 progressive disc degeneration with mild central stenosis.
Mild LEFT facet arthrosis.

## 2017-11-01 ENCOUNTER — Other Ambulatory Visit: Payer: Self-pay | Admitting: Rehabilitation

## 2017-11-01 DIAGNOSIS — M5442 Lumbago with sciatica, left side: Secondary | ICD-10-CM

## 2017-11-09 ENCOUNTER — Ambulatory Visit
Admission: RE | Admit: 2017-11-09 | Discharge: 2017-11-09 | Disposition: A | Discharge status: Home / Self Care | Payer: Medicare Other | Source: Ambulatory Visit | Attending: Rehabilitation | Admitting: Rehabilitation

## 2017-11-09 DIAGNOSIS — M5442 Lumbago with sciatica, left side: Secondary | ICD-10-CM

## 2017-11-09 MED ORDER — GADOBENATE DIMEGLUMINE 529 MG/ML IV SOLN
14.0000 mL | Freq: Once | INTRAVENOUS | Status: AC | PRN
  Administered 2017-11-09: 14 mL via INTRAVENOUS

## 2018-05-09 ENCOUNTER — Other Ambulatory Visit: Payer: Self-pay | Admitting: Family Medicine

## 2018-05-09 DIAGNOSIS — Z1231 Encounter for screening mammogram for malignant neoplasm of breast: Secondary | ICD-10-CM

## 2018-06-08 ENCOUNTER — Ambulatory Visit: Payer: Medicare Other

## 2020-11-26 ENCOUNTER — Other Ambulatory Visit: Payer: Self-pay

## 2020-11-26 ENCOUNTER — Encounter (HOSPITAL_BASED_OUTPATIENT_CLINIC_OR_DEPARTMENT_OTHER): Payer: Self-pay | Admitting: Emergency Medicine

## 2020-11-26 ENCOUNTER — Emergency Department (HOSPITAL_BASED_OUTPATIENT_CLINIC_OR_DEPARTMENT_OTHER)
Admission: EM | Admit: 2020-11-26 | Discharge: 2020-11-26 | Disposition: A | Discharge status: Home / Self Care | Payer: Medicare Other | Attending: Emergency Medicine | Admitting: Emergency Medicine

## 2020-11-26 DIAGNOSIS — Z7982 Long term (current) use of aspirin: Secondary | ICD-10-CM | POA: Insufficient documentation

## 2020-11-26 DIAGNOSIS — M545 Low back pain, unspecified: Secondary | ICD-10-CM | POA: Diagnosis present

## 2020-11-26 DIAGNOSIS — Z79899 Other long term (current) drug therapy: Secondary | ICD-10-CM | POA: Diagnosis not present

## 2020-11-26 DIAGNOSIS — M5430 Sciatica, unspecified side: Secondary | ICD-10-CM

## 2020-11-26 DIAGNOSIS — I10 Essential (primary) hypertension: Secondary | ICD-10-CM | POA: Insufficient documentation

## 2020-11-26 DIAGNOSIS — M5442 Lumbago with sciatica, left side: Secondary | ICD-10-CM | POA: Insufficient documentation

## 2020-11-26 MED ORDER — ONDANSETRON 4 MG PO TBDP: 4.0000 mg | ORAL_TABLET | Freq: Three times a day (TID) | ORAL | 0 refills | Status: DC | PRN

## 2020-11-26 MED ORDER — METHYLPREDNISOLONE 4 MG PO TBPK: ORAL_TABLET | Freq: Every day | ORAL | 0 refills | Status: DC

## 2020-11-26 MED ORDER — ONDANSETRON 4 MG PO TBDP
4.0000 mg | ORAL_TABLET | Freq: Once | ORAL | Status: AC
  Administered 2020-11-26: 4 mg via ORAL
  Filled 2020-11-26: qty 1

## 2020-11-26 MED ORDER — DEXAMETHASONE SODIUM PHOSPHATE 4 MG/ML IJ SOLN
4.0000 mg | Freq: Once | INTRAMUSCULAR | Status: AC
  Administered 2020-11-26: 4 mg via INTRAMUSCULAR
  Filled 2020-11-26: qty 1

## 2020-11-26 NOTE — ED Triage Notes (Signed)
Pt arrives to ED with c/o of severe lower back pain x11 days. Pt was seen for this on 9/16 with Ortho, who gave her pain medication, performed xrays and wanted her to get an MRI. Xray showed  no acute injuries. Pt unable to get MRI for another month. Pt reports that her back pain is so severe that she cannot perform ADLS. Pt with hx of degenerative disc dx and lumbar laminectomy. Pt w/o issues of urination and defecation. Reports left foot as some numbness.

## 2020-11-26 NOTE — ED Provider Notes (Signed)
[facility] Provider Note   CSN: [id number] Arrival date & time: 11/26/20  1034     History Chief Complaint  Patient presents with   Back Pain    Jenny Martinez is a 75 y.o. female.  Pt presents to the ED today with low back pain with pain radiating down her left leg.  She saw [facility] on 9/16 and was given a rx for Tramadol which did not help.  She saw her pcp yesterday who gave her a shot of Toradol which did not help.  She said pain meds make her sick and prednisone makes her heart skip beats.  Pt said she has taken decadron without any problems.  Pt is here to try to get a MRI.  The pt has one scheduled for October, but does not want to wait that long.      Past Medical History:  Diagnosis Date   Anxiety    Arthritis    "spine; neck thru lower back" (07/21/2012)   Chronic lower back pain    Daily headache    Elevated TSH    a. Noted 07/2012, to f/u pcp.   H/O cardiac catheterization    No angiographic evidence of CAD 07/2012   High cholesterol    Hypertension    Migraines    "much better since I went on the BP medicine" (07/21/2012)   PONV (postoperative nausea and vomiting)     Patient Active Problem List   Diagnosis Date Noted   Elevated TSH 07/22/2012   Hypertension 07/21/2012   Chronic back pain 07/21/2012   Precordial pain 07/21/2012    Past Surgical History:  Procedure Laterality Date   ABDOMINAL HYSTERECTOMY  1980's   ANTERIOR CERVICAL DECOMP/DISCECTOMY FUSION  2002?   APPENDECTOMY  1980's   "w/hysterectomy" (07/21/2012)   BILATERAL OOPHORECTOMY  1980's   "2 years after hysterectomy" (07/21/12)   CHOLECYSTECTOMY  2006?   LEFT HEART CATHETERIZATION WITH CORONARY ANGIOGRAM N/A 07/22/2012   Procedure: LEFT HEART CATHETERIZATION WITH CORONARY ANGIOGRAM;  Surgeon: Burnell Blanks, MD;  Location: [facility] CATH LAB;  Service: Cardiovascular;  Laterality: N/A;   POSTERIOR LUMBAR FUSION  09/2010   TONSILLECTOMY  1954   TUBAL  LIGATION  1980     OB History   No obstetric history on file.     Family History  Problem Relation Age of Onset   Heart attack Mother 10    Social History   Tobacco Use   Smoking status: Never   Smokeless tobacco: Never  Substance Use Topics   Alcohol use: No   Drug use: No    Home Medications Prior to Admission medications   Medication Sig Start Date End Date Taking? Authorizing Provider  ALPRAZolam Duanne Moron) 0.5 MG tablet Take 0.5 mg by mouth at bedtime.   Yes [provider]  Aspirin-Salicylamide-Caffeine (ARTHRITIS STRENGTH BC POWDER PO) Take 1 packet by mouth daily.   Yes [provider]  cyclobenzaprine (FLEXERIL) 10 MG tablet Take 10 mg by mouth at bedtime.   Yes [provider]  estradiol (ESTRACE) 1 MG tablet Take 1 mg by mouth daily.   Yes [provider]  Lactobacillus Rhamnosus, GG, (CULTURELLE) CAPS Take 1 capsule by mouth daily.   Yes [provider]  methylPREDNISolone (MEDROL DOSEPAK) 4 MG TBPK tablet Take by mouth daily. Day 1:  2 pills at breakfast, 1 pill at lunch, 1 pill after supper, 2 pills at bedtime;Day 2:  1 pill at breakfast, 1 pill  at lunch, 1 pill after supper, 2 pills at bedtime;Day 3:  1 pill at breakfast, 1 pill at lunch, 1 pill after supper, 1 pill at bedtime;Day 4:  1 pill at breakfast, 1 pill at lunch, 1 pill at bedtime;Day 5:  1 pill at breakfast, 1 pill at bedtime;Day 6:  1 pill at breakfast 11/26/20  Yes Isla Pence, MD  metoprolol succinate (TOPROL-XL) 25 MG 24 hr tablet Take 25 mg by mouth 2 (two) times daily.   Yes [provider]  Multiple Vitamins-Minerals (CENTRUM SILVER PO) Take 1 tablet by mouth daily.   Yes [provider]  ondansetron (ZOFRAN ODT) 4 MG disintegrating tablet Take 1 tablet (4 mg total) by mouth every 8 (eight) hours as needed for nausea or vomiting. 11/26/20  Yes Isla Pence, MD  Polyethyl Glycol-Propyl Glycol (SYSTANE ULTRA OP) Place 1 drop into both  eyes daily as needed (dry eyes). Reported on 04/16/2015   Yes [provider]  SALINE MIST SPRAY NA Place 1 spray into the nose as needed (congestion).   Yes [provider]    Allergies    Levaquin [levofloxacin in d5w], Morphine and related, and Prednisolone acetate  Review of Systems   Review of Systems  Musculoskeletal:  Positive for back pain.  All other systems reviewed and are negative.  Physical Exam Updated Vital Signs BP (!) 193/71 (BP Location: Right Arm)   Pulse 94   Temp 98.5 F (36.9 C) (Oral)   Resp 20   Ht 5\' 6"  (1.676 m)   Wt 65.8 kg   SpO2 100%   BMI 23.40 kg/m   Physical Exam Vitals and nursing note reviewed.  HENT:     Head: Normocephalic and atraumatic.     Right Ear: External ear normal.     Left Ear: External ear normal.     Nose: Nose normal.     Mouth/Throat:     Mouth: Mucous membranes are moist.     Pharynx: Oropharynx is clear.  Eyes:     Extraocular Movements: Extraocular movements intact.     Conjunctiva/sclera: Conjunctivae normal.     Pupils: Pupils are equal, round, and reactive to light.  Cardiovascular:     Rate and Rhythm: Normal rate and regular rhythm.     Pulses: Normal pulses.     Heart sounds: Normal heart sounds.  Pulmonary:     Effort: Pulmonary effort is normal.     Breath sounds: Normal breath sounds.  Abdominal:     General: Abdomen is flat. Bowel sounds are normal.     Palpations: Abdomen is soft.  Musculoskeletal:        General: Normal range of motion.     Cervical back: Normal range of motion and neck supple.  Skin:    General: Skin is warm.     Capillary Refill: Capillary refill takes less than 2 seconds.  Neurological:     General: No focal deficit present.     Mental Status: She is alert and oriented to person, place, and time.     Comments: + straight leg raise on left  Psychiatric:        Mood and Affect: Mood normal.        Behavior: Behavior normal.    ED Results / Procedures /  Treatments   Labs (all labs ordered are listed, but only abnormal results are displayed) Labs Reviewed - No data to display  EKG None  Radiology No results found.  Procedures Procedures  Medications Ordered in ED Medications  dexamethasone (DECADRON) injection 4 mg (has no administration in time range)  ondansetron (ZOFRAN-ODT) disintegrating tablet 4 mg (has no administration in time range)    ED Course  I have reviewed the triage vital signs and the nursing notes.  Pertinent labs & imaging results that were available during my care of the patient were reviewed by me and considered in my medical decision making (see chart for details).    MDM Rules/Calculators/A&P                           We don't have MRI available here at [facility].  I offered to send her to [facility] for MRI, but she declines.  She said [facility] is trying to get one sooner.  She is willing to try decadron and zofran for symptom control.  Pt is instructed to return if worse.  F/u with ortho.  Final Clinical Impression(s) / ED Diagnoses Final diagnoses:  Acute sciatica    Rx / DC Orders ED Discharge Orders          Ordered    methylPREDNISolone (MEDROL DOSEPAK) 4 MG TBPK tablet  Daily        11/26/20 1522    ondansetron (ZOFRAN ODT) 4 MG disintegrating tablet  Every 8 hours PRN        11/26/20 1522             Isla Pence, MD 11/26/20 1526

## 2020-12-02 ENCOUNTER — Other Ambulatory Visit: Payer: Self-pay | Admitting: Medical

## 2020-12-02 DIAGNOSIS — M545 Low back pain, unspecified: Secondary | ICD-10-CM

## 2020-12-03 ENCOUNTER — Other Ambulatory Visit: Payer: Self-pay

## 2020-12-03 ENCOUNTER — Ambulatory Visit
Admission: RE | Admit: 2020-12-03 | Discharge: 2020-12-03 | Disposition: A | Discharge status: Home / Self Care | Payer: Medicare Other | Source: Ambulatory Visit | Attending: Medical | Admitting: Medical

## 2020-12-03 DIAGNOSIS — M545 Low back pain, unspecified: Secondary | ICD-10-CM

## 2020-12-18 ENCOUNTER — Encounter: Payer: Self-pay | Admitting: Physical Therapy

## 2020-12-18 ENCOUNTER — Ambulatory Visit: Payer: Medicare Other | Attending: Orthopedic Surgery | Admitting: Physical Therapy

## 2020-12-18 ENCOUNTER — Other Ambulatory Visit: Payer: Self-pay

## 2020-12-18 DIAGNOSIS — R293 Abnormal posture: Secondary | ICD-10-CM | POA: Insufficient documentation

## 2020-12-18 DIAGNOSIS — M545 Low back pain, unspecified: Secondary | ICD-10-CM | POA: Diagnosis not present

## 2020-12-18 NOTE — Therapy (Signed)
[facility] [place], [place], [address] Phone: [phone]   Fax:  [phone]  Physical Therapy Evaluation  Patient Details  Name: Jenny Martinez MRN: [id number] Date of Birth: 01-09-1946 Referring Provider (PT): Melina Schools MD   Encounter Date: 12/18/2020   PT End of Session - 12/18/20 1237     Visit Number 1    Number of Visits 12    Date for PT Re-Evaluation 03/18/21    Authorization Type FOTO AT LEAST EVERY 5TH VISIT.  PROGRESS NOTE AT 10TH VISIT.  KX MODIFIER AFTER 15 VISITS.    PT Start Time 1115    PT Stop Time 1206    PT Time Calculation (min) 51 min    Activity Tolerance Patient tolerated treatment well    Behavior During Therapy WFL for tasks assessed/performed             Past Medical History:  Diagnosis Date   Anxiety    Arthritis    "spine; neck thru lower back" (07/21/2012)   Chronic lower back pain    Daily headache    Elevated TSH    a. Noted 07/2012, to f/u pcp.   H/O cardiac catheterization    No angiographic evidence of CAD 07/2012   High cholesterol    Hypertension    Migraines    "much better since I went on the BP medicine" (07/21/2012)   PONV (postoperative nausea and vomiting)     Past Surgical History:  Procedure Laterality Date   ABDOMINAL HYSTERECTOMY  1980's   ANTERIOR CERVICAL DECOMP/DISCECTOMY FUSION  2002?   APPENDECTOMY  1980's   "w/hysterectomy" (07/21/2012)   BILATERAL OOPHORECTOMY  1980's   "2 years after hysterectomy" (07/21/12)   CHOLECYSTECTOMY  2006?   LEFT HEART CATHETERIZATION WITH CORONARY ANGIOGRAM N/A 07/22/2012   Procedure: LEFT HEART CATHETERIZATION WITH CORONARY ANGIOGRAM;  Surgeon: Burnell Blanks, MD;  Location: [facility] CATH LAB;  Service: Cardiovascular;  Laterality: N/A;   POSTERIOR LUMBAR FUSION  09/2010   TONSILLECTOMY  1954   TUBAL LIGATION  1980    There were no vitals filed for this visit.    Subjective Assessment - 12/18/20 1239      Subjective COVID-19 screen performed prior to patient entering clinic.  The patient presents to the clinic today with c/o low back pain, left > right.  She states she was playing Pickelball on 11/15/20 and then went to get a flu shot. She was at home and leaned back while seated and felt excuriating low back pain.  She states the pain was severe for at least two weeks and her husband had to help her walk and transfer from chairs and in and out of bed.  Today, her pain is rated at a 5/10 but can rise to much higher levels when bending over and resuming the upright posture.  She uses a TENS which has been helpful to decrease her pain.    Pertinent History Chronic low back pain, ACDF, lumbar fusion, HTN. left foot numbness which she has had for a long time.    How long can you sit comfortably? unlimited.    How long can you stand comfortably? Not much of a problem.    How long can you walk comfortably? Short community distances.    Diagnostic tests KCL:[id number] Schmorl's node indenting the superior L4 endplate is  increased in conspicuity since 2019 with new surrounding marrow  edema which could reflect a source of pain.  2. Status post  L5-S1 TLIF. There is a small amount of probable disc  material along the posterior S1 endplate without evidence of  impingement of the traversing nerve roots. There is mild-to-moderate  left and no significant right neural foraminal stenosis at this  level. Findings are unchanged since 2019.  3. Otherwise, mild multilevel degenerative changes of the lumbar  spine detailed above without high-grade spinal canal or neural  foraminal stenosis, overall not significantly changed since 2019.    Currently in Pain? Yes    Pain Score 5     Pain Location Back    Pain Orientation Right    Pain Descriptors / Indicators Aching;Sharp    Pain Type Acute pain    Pain Onset More than a month ago    Pain Frequency Constant    Aggravating Factors  See above.    Pain Relieving Factors See  above.                [facility] PT Assessment - 12/18/20 0001       Assessment   Medical Diagnosis Low back pain    Referring Provider (PT) Melina Schools MD    Onset Date/Surgical Date 11/15/20      Precautions   Precaution Comments Prior lumbar fusion.      Restrictions   Weight Bearing Restrictions No      Balance Screen   Has the patient fallen in the past 6 months Yes    How many times? 1.    Has the patient had a decrease in activity level because of a fear of falling?  No    Is the patient reluctant to leave their home because of a fear of falling?  No      Home Environment   Living Environment Private residence      Prior Function   Level of Independence Independent      Observation/Other Assessments   Focus on Therapeutic Outcomes (FOTO)  Complete.      Posture/Postural Control   Posture/Postural Control Postural limitations    Postural Limitations Decreased lumbar lordosis      Deep Tendon Reflexes   DTR Assessment Site Patella;Achilles    Patella DTR 2+    Achilles DTR 0      ROM / Strength   AROM / PROM / Strength AROM;Strength      AROM   Overall AROM Comments Functional spinal movement.  Bilateral knee flexion assessed in supine is normal.      Strength   Overall Strength Comments Normal LE strength.      Palpation   Palpation comment Tender to palpation over both SIJ's left > right and left upper gluteal musculature.      Special Tests   Other special tests No significant pain reproduction with bilateral FBER testing.  Equal leg lengths.      Bed Mobility   Bed Mobility Supine to Sit    Supine to Sit Minimal Assistance - Patient > 75%      Ambulation/Gait   Gait Comments Essentiall normal today.                        Objective measurements completed on examination: See above findings.       [facility] Adult PT Treatment/Exercise - 12/18/20 0001       Modalities   Modalities Electrical Stimulation;Moist Heat       Moist Heat Therapy   Number Minutes Moist Heat 20 Minutes  Acupuncturist Location L-S region bilaterally.    Electrical Stimulation Action IFC at 80-150 Hz.    Electrical Stimulation Parameters 40% scan x 20 minutes.    Electrical Stimulation Goals Pain                          PT Long Term Goals - 12/18/20 1258       PT LONG TERM GOAL #1   Title Independent with a HEP.    Time 6    Period Weeks    Status New      PT LONG TERM GOAL #2   Title Transfer from supine to sit independently with pain not > 2-3/10.    Time 6    Period Weeks    Status New      PT LONG TERM GOAL #3   Title Perform ADL's with pain not > 3/10.    Time 6    Period Weeks    Status New      PT LONG TERM GOAL #4   Title Return to playing Pickelball.    Time 6    Period Weeks    Status New                    Plan - 12/18/20 1253     Clinical Impression Statement The patient presents to OPPT with c/o bilateral low back pain.  This onset of pain occured on 11/15/20.  she was found to be tender to palpation over her SIJ's, especially on the left.  Transitioning from supine to sit was painful and she requires assistance.  She has a past surgical history remarkable for a lumbar fusion but has done very well and even plays Pickelball which she wants to return to.  Her gait was essentially normal today though for a couple weeks after the onset of pain she required assistance from her husband to walk and transfer.  Patient will benefit from skilled physical therapy intervention to address pain and deficits.    Personal Factors and Comorbidities Comorbidity 1;Other    Comorbidities Chronic low back pain, ACDF, lumbar fusion, HTN. left foot numbness which she has had for a long time.    Examination-Activity Limitations Other;Transfers    Examination-Participation Restrictions Other    Stability/Clinical Decision Making Stable/Uncomplicated    Clinical  Decision Making Low    Rehab Potential Excellent    PT Frequency 2x / week    PT Duration 6 weeks    PT Treatment/Interventions ADLs/Self Care Home Management;Cryotherapy;Electrical Stimulation;Ultrasound;Moist Heat;Iontophoresis 4mg /ml Dexamethasone;Functional mobility training;Therapeutic activities;Therapeutic exercise;Manual techniques;Patient/family education;Passive range of motion;Dry needling    PT Next Visit Plan Combo e'stim/US, STW/M especially to sacral ligaments, dry needling.  Progression into core exercises to include hip bridging.    Consulted and Agree with Plan of Care Patient             Patient will benefit from skilled therapeutic intervention in order to improve the following deficits and impairments:  Decreased activity tolerance, Pain, Postural dysfunction, Increased muscle spasms  Visit Diagnosis: Acute bilateral low back pain without sciatica - Plan: PT plan of care cert/re-cert  Abnormal posture - Plan: PT plan of care cert/re-cert     Problem List Patient Active Problem List   Diagnosis Date Noted   Elevated TSH 07/22/2012   Hypertension 07/21/2012   Chronic back pain 07/21/2012   Precordial pain 07/21/2012    Tadeo Besecker, [place], PT 12/18/2020,  1:02 PM  [facility] [place], [place], [address] Phone: [phone]   Fax:  [phone]  Name: LATISHA LASCH MRN: [id number] Date of Birth: 07-29-1945

## 2020-12-23 ENCOUNTER — Other Ambulatory Visit: Payer: Self-pay

## 2020-12-23 ENCOUNTER — Ambulatory Visit: Payer: Medicare Other | Admitting: Physical Therapy

## 2020-12-23 DIAGNOSIS — R293 Abnormal posture: Secondary | ICD-10-CM

## 2020-12-23 DIAGNOSIS — M545 Low back pain, unspecified: Secondary | ICD-10-CM | POA: Diagnosis not present

## 2020-12-23 NOTE — Therapy (Signed)
[facility] [place], [place], [address] Phone: [phone]   Fax:  [phone]  Physical Therapy Treatment  Patient Details  Name: Jenny Martinez MRN: [id number] Date of Birth: 07-Dec-1945 Referring Provider (PT): Melina Schools MD   Encounter Date: 12/23/2020   PT End of Session - 12/23/20 1335     Visit Number 2    Number of Visits 12    Date for PT Re-Evaluation 03/18/21    Authorization Type FOTO AT LEAST EVERY 5TH VISIT.  PROGRESS NOTE AT 10TH VISIT.  KX MODIFIER AFTER 15 VISITS.    PT Start Time 0100    PT Stop Time 0156    PT Time Calculation (min) 56 min    Activity Tolerance Patient tolerated treatment well    Behavior During Therapy WFL for tasks assessed/performed             Past Medical History:  Diagnosis Date   Anxiety    Arthritis    "spine; neck thru lower back" (07/21/2012)   Chronic lower back pain    Daily headache    Elevated TSH    a. Noted 07/2012, to f/u pcp.   H/O cardiac catheterization    No angiographic evidence of CAD 07/2012   High cholesterol    Hypertension    Migraines    "much better since I went on the BP medicine" (07/21/2012)   PONV (postoperative nausea and vomiting)     Past Surgical History:  Procedure Laterality Date   ABDOMINAL HYSTERECTOMY  1980's   ANTERIOR CERVICAL DECOMP/DISCECTOMY FUSION  2002?   APPENDECTOMY  1980's   "w/hysterectomy" (07/21/2012)   BILATERAL OOPHORECTOMY  1980's   "2 years after hysterectomy" (07/21/12)   CHOLECYSTECTOMY  2006?   LEFT HEART CATHETERIZATION WITH CORONARY ANGIOGRAM N/A 07/22/2012   Procedure: LEFT HEART CATHETERIZATION WITH CORONARY ANGIOGRAM;  Surgeon: Burnell Blanks, MD;  Location: [facility] CATH LAB;  Service: Cardiovascular;  Laterality: N/A;   POSTERIOR LUMBAR FUSION  09/2010   TONSILLECTOMY  1954   TUBAL LIGATION  1980    There were no vitals filed for this visit.   Subjective Assessment - 12/23/20 1335      Subjective COVID-19 screen performed prior to patient entering clinic.  Pain not too bad today    Pertinent History Chronic low back pain, ACDF, lumbar fusion, HTN. left foot numbness which she has had for a long time.    How long can you stand comfortably? Not much of a problem.    Diagnostic tests [id number] Schmorl's node indenting the superior L4 endplate is  increased in conspicuity since 2019 with new surrounding marrow  edema which could reflect a source of pain.  2. Status post L5-S1 TLIF. There is a small amount of probable disc  material along the posterior S1 endplate without evidence of  impingement of the traversing nerve roots. There is mild-to-moderate  left and no significant right neural foraminal stenosis at this  level. Findings are unchanged since 2019.  3. Otherwise, mild multilevel degenerative changes of the lumbar  spine detailed above without high-grade spinal canal or neural  foraminal stenosis, overall not significantly changed since 2019.    Currently in Pain? Yes    Pain Score 3     Pain Location Back    Pain Orientation Right;Left    Pain Descriptors / Indicators Aching    Pain Type Acute pain    Pain Onset More than a month ago  [facility] Adult PT Treatment/Exercise - 12/23/20 0001       Modalities   Modalities Electrical Stimulation;Moist Heat;Ultrasound      Moist Heat Therapy   Number Minutes Moist Heat 20 Minutes      Electrical Stimulation   Electrical Stimulation Location Bilateral L-S region.    Electrical Stimulation Action IFC at 80-150 Hz.    Electrical Stimulation Parameters 40% scan x 20 minutes.    Electrical Stimulation Goals Pain      Ultrasound   Ultrasound Location Bilateral SIJ region.    Ultrasound Parameters Patient in right sdly position with folded pillow betwen knees for comfort;  Combo e'stim/US at 1.50 W/CM2 x 12 minutes.      Manual Therapy   Manual Therapy Soft tissue  mobilization    Soft tissue mobilization STW/M x 12 minutes to patient's bilateral low back musculature and sacral ligaments to decrease pain.                          PT Long Term Goals - 12/18/20 1258       PT LONG TERM GOAL #1   Title Independent with a HEP.    Time 6    Period Weeks    Status New      PT LONG TERM GOAL #2   Title Transfer from supine to sit independently with pain not > 2-3/10.    Time 6    Period Weeks    Status New      PT LONG TERM GOAL #3   Title Perform ADL's with pain not > 3/10.    Time 6    Period Weeks    Status New      PT LONG TERM GOAL #4   Title Return to playing Pickelball.    Time 6    Period Weeks    Status New                   Plan - 12/23/20 1338     Clinical Impression Statement Patient reporting a decrease in pain.  She was alos walking with a normal cadenece today.    Personal Factors and Comorbidities Comorbidity 1;Other    Comorbidities Chronic low back pain, ACDF, lumbar fusion, HTN. left foot numbness which she has had for a long time.    Examination-Activity Limitations Other;Transfers    Examination-Participation Restrictions Other    Stability/Clinical Decision Making Stable/Uncomplicated    Rehab Potential Excellent    PT Frequency 2x / week    PT Duration 6 weeks    PT Treatment/Interventions ADLs/Self Care Home Management;Cryotherapy;Electrical Stimulation;Ultrasound;Moist Heat;Iontophoresis 4mg /ml Dexamethasone;Functional mobility training;Therapeutic activities;Therapeutic exercise;Manual techniques;Patient/family education;Passive range of motion;Dry needling    PT Next Visit Plan Combo e'stim/US, STW/M especially to sacral ligaments, dry needling.  Progression into core exercises to include hip bridging.    Consulted and Agree with Plan of Care Patient             Patient will benefit from skilled therapeutic intervention in order to improve the following deficits and impairments:      Visit Diagnosis: Acute bilateral low back pain without sciatica  Abnormal posture     Problem List Patient Active Problem List   Diagnosis Date Noted   Elevated TSH 07/22/2012   Hypertension 07/21/2012   Chronic back pain 07/21/2012   Precordial pain 07/21/2012    Joseph Johns, [place], PT 12/23/2020, 2:20 PM  [facility] [place]  [place], [place], [address] Phone: [phone]   Fax:  [phone]  Name: Jenny Martinez MRN: [id number] Date of Birth: [birth year]

## 2020-12-26 ENCOUNTER — Other Ambulatory Visit: Payer: Self-pay

## 2020-12-26 ENCOUNTER — Ambulatory Visit: Payer: Medicare Other | Admitting: Physical Therapy

## 2020-12-26 ENCOUNTER — Encounter: Payer: Self-pay | Admitting: Physical Therapy

## 2020-12-26 DIAGNOSIS — M545 Low back pain, unspecified: Secondary | ICD-10-CM | POA: Diagnosis not present

## 2020-12-26 DIAGNOSIS — R293 Abnormal posture: Secondary | ICD-10-CM

## 2020-12-26 NOTE — Therapy (Signed)
[facility] [place], [place], [address] Phone: [phone]   Fax:  [phone]  Physical Therapy Treatment  Patient Details  Name: Jenny Martinez MRN: [id number] Date of Birth: 03-May-1945 Referring Provider (PT): Melina Schools MD   Encounter Date: 12/26/2020   PT End of Session - 12/26/20 1423     Visit Number 3    Date for PT Re-Evaluation 03/18/21    Authorization Type FOTO AT LEAST EVERY 5TH VISIT.  PROGRESS NOTE AT 10TH VISIT.  KX MODIFIER AFTER 15 VISITS.    PT Start Time 0100    PT Stop Time 0156    PT Time Calculation (min) 56 min    Activity Tolerance Patient tolerated treatment well    Behavior During Therapy WFL for tasks assessed/performed             Past Medical History:  Diagnosis Date   Anxiety    Arthritis    "spine; neck thru lower back" (07/21/2012)   Chronic lower back pain    Daily headache    Elevated TSH    a. Noted 07/2012, to f/u pcp.   H/O cardiac catheterization    No angiographic evidence of CAD 07/2012   High cholesterol    Hypertension    Migraines    "much better since I went on the BP medicine" (07/21/2012)   PONV (postoperative nausea and vomiting)     Past Surgical History:  Procedure Laterality Date   ABDOMINAL HYSTERECTOMY  1980's   ANTERIOR CERVICAL DECOMP/DISCECTOMY FUSION  2002?   APPENDECTOMY  1980's   "w/hysterectomy" (07/21/2012)   BILATERAL OOPHORECTOMY  1980's   "2 years after hysterectomy" (07/21/12)   CHOLECYSTECTOMY  2006?   LEFT HEART CATHETERIZATION WITH CORONARY ANGIOGRAM N/A 07/22/2012   Procedure: LEFT HEART CATHETERIZATION WITH CORONARY ANGIOGRAM;  Surgeon: Burnell Blanks, MD;  Location: [facility] CATH LAB;  Service: Cardiovascular;  Laterality: N/A;   POSTERIOR LUMBAR FUSION  09/2010   TONSILLECTOMY  1954   TUBAL LIGATION  1980    There were no vitals filed for this visit.   Subjective Assessment - 12/26/20 1341     Subjective COVID-19 screen  performed prior to patient entering clinic.  Doing good.    Pertinent History Chronic low back pain, ACDF, lumbar fusion, HTN. left foot numbness which she has had for a long time.    How long can you sit comfortably? unlimited.    How long can you stand comfortably? Not much of a problem.    Diagnostic tests [id number] Schmorl's node indenting the superior L4 endplate is  increased in conspicuity since 2019 with new surrounding marrow  edema which could reflect a source of pain.  2. Status post L5-S1 TLIF. There is a small amount of probable disc  material along the posterior S1 endplate without evidence of  impingement of the traversing nerve roots. There is mild-to-moderate  left and no significant right neural foraminal stenosis at this  level. Findings are unchanged since 2019.  3. Otherwise, mild multilevel degenerative changes of the lumbar  spine detailed above without high-grade spinal canal or neural  foraminal stenosis, overall not significantly changed since 2019.    Currently in Pain? Yes    Pain Location Back    Pain Orientation Left    Pain Descriptors / Indicators Aching    Pain Onset More than a month ago  [facility] Adult PT Treatment/Exercise - 12/26/20 0001       Modalities   Modalities Electrical Stimulation;Moist Heat;Ultrasound      Moist Heat Therapy   Number Minutes Moist Heat 20 Minutes      Electrical Stimulation   Electrical Stimulation Location Left low back, SIJ/upper gluteal region. region.    Electrical Stimulation Action IFC at 80-150 Hz.    Electrical Stimulation Parameters 40% scan x 20 minutes.    Electrical Stimulation Goals Pain      Ultrasound   Ultrasound Location Left SIJ, low back and upper glteal region    Ultrasound Parameters Combo e'stim/US at 1.50 W/CM2 x 12 minutes.      Manual Therapy   Manual Therapy Soft tissue mobilization    Soft tissue mobilization STW/M x 12 minutes to patient's  left upper gluteal musculature, SIJ and QL to decrease tone.                          PT Long Term Goals - 12/18/20 1258       PT LONG TERM GOAL #1   Title Independent with a HEP.    Time 6    Period Weeks    Status New      PT LONG TERM GOAL #2   Title Transfer from supine to sit independently with pain not > 2-3/10.    Time 6    Period Weeks    Status New      PT LONG TERM GOAL #3   Title Perform ADL's with pain not > 3/10.    Time 6    Period Weeks    Status New      PT LONG TERM GOAL #4   Title Return to playing Pickelball.    Time 6    Period Weeks    Status New                   Plan - 12/26/20 1345     Clinical Impression Statement Patient is making very good practice.  Added adductor squeezes to HEP and discussed easily swinging Pickelball racket in house.    Personal Factors and Comorbidities Comorbidity 1;Other    Comorbidities Chronic low back pain, ACDF, lumbar fusion, HTN. left foot numbness which she has had for a long time.    Examination-Activity Limitations Other;Transfers    Examination-Participation Restrictions Other    Stability/Clinical Decision Making Stable/Uncomplicated    Rehab Potential Excellent    PT Frequency 2x / week    PT Duration 6 weeks    PT Treatment/Interventions ADLs/Self Care Home Management;Cryotherapy;Electrical Stimulation;Ultrasound;Moist Heat;Iontophoresis 4mg /ml Dexamethasone;Functional mobility training;Therapeutic activities;Therapeutic exercise;Manual techniques;Patient/family education;Passive range of motion;Dry needling    PT Next Visit Plan Combo e'stim/US, STW/M especially to sacral ligaments, dry needling.  Progression into core exercises to include hip bridging.    Consulted and Agree with Plan of Care Patient             Patient will benefit from skilled therapeutic intervention in order to improve the following deficits and impairments:  Decreased activity tolerance, Pain, Postural  dysfunction, Increased muscle spasms  Visit Diagnosis: Acute bilateral low back pain without sciatica  Abnormal posture     Problem List Patient Active Problem List   Diagnosis Date Noted   Elevated TSH 07/22/2012   Hypertension 07/21/2012   Chronic back pain 07/21/2012   Precordial pain 07/21/2012    Francee Setzer, [place], PT 12/26/2020, 2:24 PM  [facility] [place], [place], [address] Phone: [phone]   Fax:  [phone]  Name: BREELY PANIK MRN: [id number] Date of Birth: 1945-06-22

## 2020-12-30 ENCOUNTER — Ambulatory Visit: Payer: Medicare Other | Admitting: Physical Therapy

## 2020-12-30 ENCOUNTER — Other Ambulatory Visit: Payer: Self-pay

## 2020-12-30 DIAGNOSIS — M545 Low back pain, unspecified: Secondary | ICD-10-CM | POA: Diagnosis not present

## 2020-12-30 DIAGNOSIS — R293 Abnormal posture: Secondary | ICD-10-CM

## 2020-12-30 NOTE — Therapy (Signed)
[facility] [place], [place], [address] Phone: [phone]   Fax:  [phone]  Physical Therapy Treatment  Patient Details  Name: Jenny Martinez MRN: [id number] Date of Birth: Nov 09, 1945 Referring Provider (PT): Melina Schools MD   Encounter Date: 12/30/2020   PT End of Session - 12/30/20 1400     Visit Number 4    Number of Visits 12    Date for PT Re-Evaluation 03/18/21    Authorization Type FOTO AT LEAST EVERY 5TH VISIT.  PROGRESS NOTE AT 10TH VISIT.  KX MODIFIER AFTER 15 VISITS.    PT Start Time 0100    PT Stop Time 0157    PT Time Calculation (min) 57 min    Activity Tolerance Patient tolerated treatment well    Behavior During Therapy WFL for tasks assessed/performed             Past Medical History:  Diagnosis Date   Anxiety    Arthritis    "spine; neck thru lower back" (07/21/2012)   Chronic lower back pain    Daily headache    Elevated TSH    a. Noted 07/2012, to f/u pcp.   H/O cardiac catheterization    No angiographic evidence of CAD 07/2012   High cholesterol    Hypertension    Migraines    "much better since I went on the BP medicine" (07/21/2012)   PONV (postoperative nausea and vomiting)     Past Surgical History:  Procedure Laterality Date   ABDOMINAL HYSTERECTOMY  1980's   ANTERIOR CERVICAL DECOMP/DISCECTOMY FUSION  2002?   APPENDECTOMY  1980's   "w/hysterectomy" (07/21/2012)   BILATERAL OOPHORECTOMY  1980's   "2 years after hysterectomy" (07/21/12)   CHOLECYSTECTOMY  2006?   LEFT HEART CATHETERIZATION WITH CORONARY ANGIOGRAM N/A 07/22/2012   Procedure: LEFT HEART CATHETERIZATION WITH CORONARY ANGIOGRAM;  Surgeon: Burnell Blanks, MD;  Location: [facility] CATH LAB;  Service: Cardiovascular;  Laterality: N/A;   POSTERIOR LUMBAR FUSION  09/2010   TONSILLECTOMY  1954   TUBAL LIGATION  1980    There were no vitals filed for this visit.   Subjective Assessment - 12/30/20 1407      Subjective COVID-19 screen performed prior to patient entering clinic.  Pain low.    Pertinent History Chronic low back pain, ACDF, lumbar fusion, HTN. left foot numbness which she has had for a long time.    How long can you sit comfortably? unlimited.    How long can you stand comfortably? Not much of a problem.    How long can you walk comfortably? Short community distances.    Diagnostic tests [id number] Schmorl's node indenting the superior L4 endplate is  increased in conspicuity since 2019 with new surrounding marrow  edema which could reflect a source of pain.  2. Status post L5-S1 TLIF. There is a small amount of probable disc  material along the posterior S1 endplate without evidence of  impingement of the traversing nerve roots. There is mild-to-moderate  left and no significant right neural foraminal stenosis at this  level. Findings are unchanged since 2019.  3. Otherwise, mild multilevel degenerative changes of the lumbar  spine detailed above without high-grade spinal canal or neural  foraminal stenosis, overall not significantly changed since 2019.    Currently in Pain? Yes    Pain Location Back    Pain Orientation Left    Pain Descriptors / Indicators Aching    Pain Type Acute pain  Pain Onset More than a month ago                               [facility] Adult PT Treatment/Exercise - 12/30/20 0001       Modalities   Modalities Electrical Stimulation;Moist Heat;Ultrasound      Moist Heat Therapy   Number Minutes Moist Heat 20 Minutes    Moist Heat Location Lumbar Spine      Electrical Stimulation   Electrical Stimulation Location Left low back/SIJ/upper gluteal region.    Electrical Stimulation Action IFC at 80-150 Hz.    Electrical Stimulation Parameters 40% scanx 20 minutes.      Ultrasound   Ultrasound Location Left low back/SIJ region.    Ultrasound Parameters Combo e'stim/US at 1.50 W/CM@ x 12 minutes.      Manual Therapy   Manual Therapy  Soft tissue mobilization    Soft tissue mobilization STW/M x 12 minutes to patient's left low back, SIJ and upper gluteal region.                          PT Long Term Goals - 12/18/20 1258       PT LONG TERM GOAL #1   Title Independent with a HEP.    Time 6    Period Weeks    Status New      PT LONG TERM GOAL #2   Title Transfer from supine to sit independently with pain not > 2-3/10.    Time 6    Period Weeks    Status New      PT LONG TERM GOAL #3   Title Perform ADL's with pain not > 3/10.    Time 6    Period Weeks    Status New      PT LONG TERM GOAL #4   Title Return to playing Pickelball.    Time 6    Period Weeks    Status New                   Plan - 12/30/20 1411     Clinical Impression Statement The patient is making excellent progress.  No pain compliants following treatment today.  She is now easily performing hip bridges and transitioning from supine to sit with no pain increase.    Personal Factors and Comorbidities Comorbidity 1;Other    Comorbidities Chronic low back pain, ACDF, lumbar fusion, HTN. left foot numbness which she has had for a long time.    Examination-Activity Limitations Other;Transfers    Examination-Participation Restrictions Other    Stability/Clinical Decision Making Stable/Uncomplicated    Rehab Potential Excellent    PT Frequency 2x / week    PT Duration 6 weeks    PT Treatment/Interventions ADLs/Self Care Home Management;Cryotherapy;Electrical Stimulation;Ultrasound;Moist Heat;Iontophoresis 4mg /ml Dexamethasone;Functional mobility training;Therapeutic activities;Therapeutic exercise;Manual techniques;Patient/family education;Passive range of motion;Dry needling    PT Next Visit Plan Combo e'stim/US, STW/M especially to sacral ligaments, dry needling.  Progression into core exercises to include hip bridging.    Consulted and Agree with Plan of Care Patient             Patient will benefit from  skilled therapeutic intervention in order to improve the following deficits and impairments:  Decreased activity tolerance, Pain, Postural dysfunction, Increased muscle spasms  Visit Diagnosis: Acute bilateral low back pain without sciatica  Abnormal posture     Problem List  Patient Active Problem List   Diagnosis Date Noted   Elevated TSH 07/22/2012   Hypertension 07/21/2012   Chronic back pain 07/21/2012   Precordial pain 07/21/2012    Chrystle Murillo, [place], PT 12/30/2020, 2:13 PM  [facility] [place], [place], [address] Phone: [phone]   Fax:  [phone]  Name: Jenny Martinez MRN: [id number] Date of Birth: 1945/07/13

## 2021-01-01 ENCOUNTER — Encounter: Payer: Medicare Other | Admitting: Physical Therapy

## 2021-01-06 ENCOUNTER — Telehealth: Payer: Self-pay | Admitting: Physical Therapy

## 2021-01-06 ENCOUNTER — Ambulatory Visit: Payer: Medicare Other | Admitting: Physical Therapy

## 2021-01-06 NOTE — Telephone Encounter (Signed)
Patient called she will not be here today and will return on Thursday

## 2021-01-09 ENCOUNTER — Other Ambulatory Visit: Payer: Self-pay

## 2021-01-09 ENCOUNTER — Ambulatory Visit: Payer: Medicare Other | Attending: Orthopedic Surgery | Admitting: Physical Therapy

## 2021-01-09 DIAGNOSIS — M545 Low back pain, unspecified: Secondary | ICD-10-CM | POA: Diagnosis present

## 2021-01-09 DIAGNOSIS — R293 Abnormal posture: Secondary | ICD-10-CM | POA: Insufficient documentation

## 2021-01-09 NOTE — Therapy (Addendum)
[facility] [place], [place], [address] Phone: [phone]   Fax:  [phone]  Physical Therapy Treatment  Patient Details  Name: Jenny Martinez MRN: [id number] Date of Birth: 07-27-1945 Referring Provider (PT): Melina Schools MD   Encounter Date: 01/09/2021   PT End of Session - 01/09/21 1337     Visit Number 5    Number of Visits 12    Date for PT Re-Evaluation 03/18/21    Authorization Type FOTO AT LEAST EVERY 5TH VISIT.  PROGRESS NOTE AT 10TH VISIT.  KX MODIFIER AFTER 15 VISITS.    PT Start Time 0100    PT Stop Time 0155    PT Time Calculation (min) 55 min             Past Medical History:  Diagnosis Date   Anxiety    Arthritis    "spine; neck thru lower back" (07/21/2012)   Chronic lower back pain    Daily headache    Elevated TSH    a. Noted 07/2012, to f/u pcp.   H/O cardiac catheterization    No angiographic evidence of CAD 07/2012   High cholesterol    Hypertension    Migraines    "much better since I went on the BP medicine" (07/21/2012)   PONV (postoperative nausea and vomiting)     Past Surgical History:  Procedure Laterality Date   ABDOMINAL HYSTERECTOMY  1980's   ANTERIOR CERVICAL DECOMP/DISCECTOMY FUSION  2002?   APPENDECTOMY  1980's   "w/hysterectomy" (07/21/2012)   BILATERAL OOPHORECTOMY  1980's   "2 years after hysterectomy" (07/21/12)   CHOLECYSTECTOMY  2006?   LEFT HEART CATHETERIZATION WITH CORONARY ANGIOGRAM N/A 07/22/2012   Procedure: LEFT HEART CATHETERIZATION WITH CORONARY ANGIOGRAM;  Surgeon: Burnell Blanks, MD;  Location: [facility] CATH LAB;  Service: Cardiovascular;  Laterality: N/A;   POSTERIOR LUMBAR FUSION  09/2010   TONSILLECTOMY  1954   TUBAL LIGATION  1980    There were no vitals filed for this visit.   Subjective Assessment - 01/09/21 1337     Subjective COVID-19 screen performed prior to patient entering clinic.  Back doing good.    Pertinent History Chronic  low back pain, ACDF, lumbar fusion, HTN. left foot numbness which she has had for a long time.    How long can you sit comfortably? unlimited.    How long can you stand comfortably? Not much of a problem.    How long can you walk comfortably? Short community distances.    Diagnostic tests HAF:BXUXYBFXO Schmorl's node indenting the superior L4 endplate is  increased in conspicuity since 2019 with new surrounding marrow  edema which could reflect a source of pain.  2. Status post L5-S1 TLIF. There is a small amount of probable disc  material along the posterior S1 endplate without evidence of  impingement of the traversing nerve roots. There is mild-to-moderate  left and no significant right neural foraminal stenosis at this  level. Findings are unchanged since 2019.  3. Otherwise, mild multilevel degenerative changes of the lumbar  spine detailed above without high-grade spinal canal or neural  foraminal stenosis, overall not significantly changed since 2019.    Currently in Pain? Yes    Pain Score 2     Pain Orientation Left                               [facility] Adult PT Treatment/Exercise -  01/09/21 0001       Modalities   Modalities Electrical Stimulation;Ultrasound      Moist Heat Therapy   Number Minutes Moist Heat 20 Minutes    Moist Heat Location Lumbar Spine      Electrical Stimulation   Electrical Stimulation Location Left LB/SIJ    Electrical Stimulation Action IFC at 80-150 Hz.    Electrical Stimulation Parameters 40% scan x 20 minutes.    Electrical Stimulation Goals Pain      Ultrasound   Ultrasound Location Left low back/SIJ    Ultrasound Parameters Combo e'stim/US at 1.50 W/CM2 x 12 minutes.      Manual Therapy   Manual Therapy Soft tissue mobilization    Soft tissue mobilization STW/M x 11 minutes to patient's left low back and SIJ.                          PT Long Term Goals - 12/18/20 1258       PT LONG TERM GOAL #1   Title  Independent with a HEP.    Time 6    Period Weeks    Status New      PT LONG TERM GOAL #2   Title Transfer from supine to sit independently with pain not > 2-3/10.    Time 6    Period Weeks    Status New      PT LONG TERM GOAL #3   Title Perform ADL's with pain not > 3/10.    Time 6    Period Weeks    Status New      PT LONG TERM GOAL #4   Title Return to playing Pickelball.    Time 6    Period Weeks    Status New                   Plan - 01/09/21 1339     Clinical Impression Statement The patient is responding very well to treatments and her pain is staying consistently low.  We discussed going back to Pickleball at an easy pace.    Personal Factors and Comorbidities Comorbidity 1;Other    Comorbidities Chronic low back pain, ACDF, lumbar fusion, HTN. left foot numbness which she has had for a long time.    Examination-Activity Limitations Other;Transfers    Examination-Participation Restrictions Other    Stability/Clinical Decision Making Stable/Uncomplicated    Rehab Potential Good    PT Frequency 2x / week    PT Duration 4 weeks    PT Treatment/Interventions ADLs/Self Care Home Management;Cryotherapy;Electrical Stimulation;Ultrasound;Moist Heat;Iontophoresis 4mg /ml Dexamethasone;Functional mobility training;Therapeutic activities;Therapeutic exercise;Manual techniques;Patient/family education;Passive range of motion;Dry needling    PT Next Visit Plan Combo e'stim/US, STW/M especially to sacral ligaments, dry needling.  Progression into core exercises to include hip bridging.    Consulted and Agree with Plan of Care Patient             Patient will benefit from skilled therapeutic intervention in order to improve the following deficits and impairments:  Decreased activity tolerance, Pain, Postural dysfunction, Increased muscle spasms  Visit Diagnosis: Acute bilateral low back pain without sciatica  Abnormal posture     Problem List Patient Active  Problem List   Diagnosis Date Noted   Elevated TSH 07/22/2012   Hypertension 07/21/2012   Chronic back pain 07/21/2012   Precordial pain 07/21/2012   PHYSICAL THERAPY DISCHARGE SUMMARY  Visits from Start of Care: 5.  Current functional level related  to goals / functional outcomes: See above.   Remaining deficits: See below.   Education / Equipment: HEP.   Patient agrees to discharge. Patient goals were not met. Patient is being discharged due to being pleased with the current functional level.  Jenny Martinez, Jenny Martinez, PT 01/09/2021, 2:15 PM  [facility] [address] [place], [place], [address] Phone: [phone]   Fax:  [phone]  Name: Jenny Martinez MRN: [id number] Date of Birth: 15-Nov-1945

## 2021-01-13 ENCOUNTER — Ambulatory Visit: Payer: Medicare Other | Admitting: Physical Therapy

## 2021-01-16 ENCOUNTER — Encounter: Payer: Medicare Other | Admitting: Physical Therapy

## 2021-06-26 ENCOUNTER — Inpatient Hospital Stay (HOSPITAL_COMMUNITY)
Admission: EM | Admit: 2021-06-26 | Discharge: 2021-07-02 | DRG: 871 | Disposition: A | Discharge status: Home / Self Care | Payer: Medicare Other | Attending: Family Medicine | Admitting: Family Medicine

## 2021-06-26 ENCOUNTER — Emergency Department (HOSPITAL_COMMUNITY): Payer: Medicare Other

## 2021-06-26 ENCOUNTER — Encounter (HOSPITAL_COMMUNITY): Payer: Self-pay

## 2021-06-26 ENCOUNTER — Other Ambulatory Visit: Payer: Self-pay

## 2021-06-26 DIAGNOSIS — E7800 Pure hypercholesterolemia, unspecified: Secondary | ICD-10-CM | POA: Diagnosis present

## 2021-06-26 DIAGNOSIS — Z20822 Contact with and (suspected) exposure to covid-19: Secondary | ICD-10-CM | POA: Diagnosis present

## 2021-06-26 DIAGNOSIS — T451X5A Adverse effect of antineoplastic and immunosuppressive drugs, initial encounter: Secondary | ICD-10-CM | POA: Diagnosis present

## 2021-06-26 DIAGNOSIS — Z79818 Long term (current) use of other agents affecting estrogen receptors and estrogen levels: Secondary | ICD-10-CM | POA: Diagnosis not present

## 2021-06-26 DIAGNOSIS — A419 Sepsis, unspecified organism: Principal | ICD-10-CM | POA: Diagnosis present

## 2021-06-26 DIAGNOSIS — Z981 Arthrodesis status: Secondary | ICD-10-CM | POA: Diagnosis not present

## 2021-06-26 DIAGNOSIS — A09 Infectious gastroenteritis and colitis, unspecified: Secondary | ICD-10-CM | POA: Diagnosis present

## 2021-06-26 DIAGNOSIS — R6510 Systemic inflammatory response syndrome (SIRS) of non-infectious origin without acute organ dysfunction: Secondary | ICD-10-CM | POA: Diagnosis present

## 2021-06-26 DIAGNOSIS — C210 Malignant neoplasm of anus, unspecified: Secondary | ICD-10-CM | POA: Diagnosis present

## 2021-06-26 DIAGNOSIS — R1110 Vomiting, unspecified: Secondary | ICD-10-CM

## 2021-06-26 DIAGNOSIS — Z888 Allergy status to other drugs, medicaments and biological substances status: Secondary | ICD-10-CM

## 2021-06-26 DIAGNOSIS — Z79899 Other long term (current) drug therapy: Secondary | ICD-10-CM

## 2021-06-26 DIAGNOSIS — D61810 Antineoplastic chemotherapy induced pancytopenia: Secondary | ICD-10-CM | POA: Diagnosis present

## 2021-06-26 DIAGNOSIS — Z8249 Family history of ischemic heart disease and other diseases of the circulatory system: Secondary | ICD-10-CM | POA: Diagnosis not present

## 2021-06-26 DIAGNOSIS — I10 Essential (primary) hypertension: Secondary | ICD-10-CM | POA: Diagnosis present

## 2021-06-26 DIAGNOSIS — Z881 Allergy status to other antibiotic agents status: Secondary | ICD-10-CM

## 2021-06-26 DIAGNOSIS — F419 Anxiety disorder, unspecified: Secondary | ICD-10-CM | POA: Diagnosis present

## 2021-06-26 DIAGNOSIS — D61818 Other pancytopenia: Secondary | ICD-10-CM | POA: Diagnosis not present

## 2021-06-26 DIAGNOSIS — R339 Retention of urine, unspecified: Secondary | ICD-10-CM | POA: Diagnosis present

## 2021-06-26 DIAGNOSIS — Z885 Allergy status to narcotic agent status: Secondary | ICD-10-CM | POA: Diagnosis not present

## 2021-06-26 DIAGNOSIS — D709 Neutropenia, unspecified: Secondary | ICD-10-CM | POA: Diagnosis present

## 2021-06-26 DIAGNOSIS — R5081 Fever presenting with conditions classified elsewhere: Secondary | ICD-10-CM | POA: Diagnosis present

## 2021-06-26 DIAGNOSIS — K529 Noninfective gastroenteritis and colitis, unspecified: Secondary | ICD-10-CM | POA: Diagnosis present

## 2021-06-26 LAB — COMPREHENSIVE METABOLIC PANEL
ALT: 13 U/L (ref 0–44)
AST: 16 U/L (ref 15–41)
Albumin: 2.4 g/dL — ABNORMAL LOW (ref 3.5–5.0)
Alkaline Phosphatase: 56 U/L (ref 38–126)
Anion gap: 8 (ref 5–15)
BUN: 14 mg/dL (ref 8–23)
CO2: 24 mmol/L (ref 22–32)
Calcium: 7.1 mg/dL — ABNORMAL LOW (ref 8.9–10.3)
Chloride: 105 mmol/L (ref 98–111)
Creatinine, Ser: 0.99 mg/dL (ref 0.44–1.00)
GFR, Estimated: 60 mL/min — ABNORMAL LOW (ref >60)
Glucose, Bld: 131 mg/dL — ABNORMAL HIGH (ref 70–99)
Potassium: 3.3 mmol/L — ABNORMAL LOW (ref 3.5–5.1)
Sodium: 137 mmol/L (ref 135–145)
Total Bilirubin: 0.6 mg/dL (ref 0.3–1.2)
Total Protein: 5.3 g/dL — ABNORMAL LOW (ref 6.5–8.1)

## 2021-06-26 LAB — URINALYSIS, ROUTINE W REFLEX MICROSCOPIC
Bacteria, UA: NONE SEEN
Bilirubin Urine: NEGATIVE
Glucose, UA: NEGATIVE mg/dL
Hgb urine dipstick: NEGATIVE
Ketones, ur: 20 mg/dL — AB
Leukocytes,Ua: NEGATIVE
Nitrite: NEGATIVE
Protein, ur: 30 mg/dL — AB
Specific Gravity, Urine: 1.032 — ABNORMAL HIGH (ref 1.005–1.030)
pH: 5 (ref 5.0–8.0)

## 2021-06-26 LAB — RESP PANEL BY RT-PCR (FLU A&B, COVID) ARPGX2
Influenza A by PCR: NEGATIVE
Influenza B by PCR: NEGATIVE
SARS Coronavirus 2 by RT PCR: NEGATIVE

## 2021-06-26 LAB — PROTIME-INR
INR: 1.3 — ABNORMAL HIGH (ref 0.8–1.2)
Prothrombin Time: 15.6 seconds — ABNORMAL HIGH (ref 11.4–15.2)

## 2021-06-26 LAB — LACTIC ACID, PLASMA: Lactic Acid, Venous: 1.4 mmol/L (ref 0.5–1.9)

## 2021-06-26 MED ORDER — ONDANSETRON HCL 4 MG/2ML IJ SOLN
4.0000 mg | Freq: Four times a day (QID) | INTRAMUSCULAR | Status: DC | PRN
  Administered 2021-06-27: 4 mg via INTRAVENOUS
  Filled 2021-06-26: qty 2

## 2021-06-26 MED ORDER — VANCOMYCIN HCL IN DEXTROSE 1-5 GM/200ML-% IV SOLN
1000.0000 mg | INTRAVENOUS | Status: DC
  Administered 2021-06-27 – 2021-06-28 (×2): 1000 mg via INTRAVENOUS
  Filled 2021-06-26 (×2): qty 200

## 2021-06-26 MED ORDER — METOCLOPRAMIDE HCL 5 MG/ML IJ SOLN
5.0000 mg | Freq: Once | INTRAMUSCULAR | Status: AC
  Administered 2021-06-26: 5 mg via INTRAVENOUS
  Filled 2021-06-26: qty 2

## 2021-06-26 MED ORDER — DICYCLOMINE HCL 10 MG PO CAPS
10.0000 mg | ORAL_CAPSULE | Freq: Three times a day (TID) | ORAL | Status: DC
  Administered 2021-06-26 – 2021-06-27 (×2): 10 mg via ORAL
  Filled 2021-06-26 (×2): qty 1

## 2021-06-26 MED ORDER — IOHEXOL 300 MG/ML  SOLN
100.0000 mL | Freq: Once | INTRAMUSCULAR | Status: AC | PRN
  Administered 2021-06-26: 100 mL via INTRAVENOUS

## 2021-06-26 MED ORDER — LACTATED RINGERS IV SOLN: INTRAVENOUS | Status: DC

## 2021-06-26 MED ORDER — SODIUM CHLORIDE 0.9 % IV BOLUS
1000.0000 mL | Freq: Once | INTRAVENOUS | Status: AC
  Administered 2021-06-26: 1000 mL via INTRAVENOUS

## 2021-06-26 MED ORDER — ACETAMINOPHEN 500 MG PO TABS
1000.0000 mg | ORAL_TABLET | Freq: Once | ORAL | Status: DC
  Filled 2021-06-26: qty 2

## 2021-06-26 MED ORDER — ACETAMINOPHEN 325 MG PO TABS
650.0000 mg | ORAL_TABLET | Freq: Four times a day (QID) | ORAL | Status: DC | PRN
  Administered 2021-06-27 (×4): 650 mg via ORAL
  Filled 2021-06-26 (×4): qty 2

## 2021-06-26 MED ORDER — SODIUM CHLORIDE 0.9 % IV SOLN
2.0000 g | Freq: Once | INTRAVENOUS | Status: AC
  Administered 2021-06-26: 2 g via INTRAVENOUS
  Filled 2021-06-26: qty 12.5

## 2021-06-26 MED ORDER — ACETAMINOPHEN 650 MG RE SUPP
650.0000 mg | Freq: Once | RECTAL | Status: DC
  Filled 2021-06-26: qty 1

## 2021-06-26 MED ORDER — LACTATED RINGERS IV BOLUS
1000.0000 mL | Freq: Once | INTRAVENOUS | Status: AC
  Administered 2021-06-26: 1000 mL via INTRAVENOUS

## 2021-06-26 MED ORDER — VANCOMYCIN HCL IN DEXTROSE 1-5 GM/200ML-% IV SOLN
1000.0000 mg | Freq: Once | INTRAVENOUS | Status: DC
  Filled 2021-06-26: qty 200

## 2021-06-26 MED ORDER — ESTRADIOL 1 MG PO TABS
1.0000 mg | ORAL_TABLET | Freq: Every day | ORAL | Status: DC
  Administered 2021-06-27 – 2021-07-02 (×6): 1 mg via ORAL
  Filled 2021-06-26 (×6): qty 1

## 2021-06-26 MED ORDER — FENTANYL CITRATE PF 50 MCG/ML IJ SOSY
50.0000 ug | PREFILLED_SYRINGE | INTRAMUSCULAR | Status: DC | PRN
  Administered 2021-06-26: 50 ug via INTRAVENOUS
  Filled 2021-06-26 (×2): qty 1

## 2021-06-26 MED ORDER — ACETAMINOPHEN 10 MG/ML IV SOLN
1000.0000 mg | Freq: Four times a day (QID) | INTRAVENOUS | Status: DC
  Administered 2021-06-26: 1000 mg via INTRAVENOUS
  Filled 2021-06-26 (×4): qty 100

## 2021-06-26 MED ORDER — VANCOMYCIN HCL 1250 MG/250ML IV SOLN
1250.0000 mg | Freq: Once | INTRAVENOUS | Status: AC
  Administered 2021-06-26: 1250 mg via INTRAVENOUS
  Filled 2021-06-26: qty 250

## 2021-06-26 MED ORDER — SODIUM CHLORIDE 0.9 % IV SOLN
2.0000 g | Freq: Two times a day (BID) | INTRAVENOUS | Status: DC
  Administered 2021-06-26: 2 g via INTRAVENOUS
  Filled 2021-06-26 (×2): qty 12.5

## 2021-06-26 MED ORDER — ACETAMINOPHEN 650 MG RE SUPP: 650.0000 mg | Freq: Four times a day (QID) | RECTAL | Status: DC | PRN

## 2021-06-26 MED ORDER — METOPROLOL SUCCINATE ER 25 MG PO TB24
25.0000 mg | ORAL_TABLET | Freq: Two times a day (BID) | ORAL | Status: DC
  Administered 2021-06-27 – 2021-06-28 (×3): 25 mg via ORAL
  Filled 2021-06-26 (×3): qty 1

## 2021-06-26 MED ORDER — ONDANSETRON HCL 4 MG PO TABS: 4.0000 mg | ORAL_TABLET | Freq: Four times a day (QID) | ORAL | Status: DC | PRN

## 2021-06-26 MED ORDER — METRONIDAZOLE 500 MG/100ML IV SOLN
500.0000 mg | Freq: Once | INTRAVENOUS | Status: AC
  Administered 2021-06-26: 500 mg via INTRAVENOUS
  Filled 2021-06-26: qty 100

## 2021-06-26 MED ORDER — POTASSIUM CHLORIDE CRYS ER 20 MEQ PO TBCR
40.0000 meq | EXTENDED_RELEASE_TABLET | ORAL | Status: AC
  Administered 2021-06-26 – 2021-06-27 (×2): 40 meq via ORAL
  Filled 2021-06-26 (×2): qty 2

## 2021-06-26 MED ORDER — ALPRAZOLAM 0.5 MG PO TABS
0.5000 mg | ORAL_TABLET | Freq: Every day | ORAL | Status: DC
  Administered 2021-06-26 – 2021-07-01 (×6): 0.5 mg via ORAL
  Filled 2021-06-26 (×6): qty 1

## 2021-06-26 NOTE — ED Notes (Signed)
Bladder scan showed +1010ms. Nurse notified ?

## 2021-06-26 NOTE — ED Triage Notes (Signed)
Patient via EMS with complaints of fever and abdominal pain for several days. States she is receiving cancer treatments for anal cancer.  ?

## 2021-06-26 NOTE — Assessment & Plan Note (Addendum)
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

## 2021-06-26 NOTE — Assessment & Plan Note (Addendum)
Pancytopenia with leukopenia of 0.3, thrombocytopenia of 62, and anemia hemoglobin 8.4-baseline 11-13.   ?Likely secondary to chemotherapy-she is on mitomycin and 5-FU. ?4/23--transfused one unit PRBC>>Hgb remains stable ?4/24--tranfuse one unit platelets ?

## 2021-06-26 NOTE — H&P (Signed)
?History and Physical  ? ? ?Jenny Martinez:[id number] DOB: 05/31/1945 DOA: 06/26/2021 ? ?PCP: Jettie Booze, NP  ? ?Patient coming from: Home ? ?I have personally briefly reviewed patient's old medical records in [place] ? ?Chief Complaint: fever, abdominal pain ? ?HPI: Jenny Martinez is a 76 y.o. female with medical history significant for squamous cell carcinoma of the anus, hypertension. ?Patient presented to the ED with complaints of fever, diffuse abdominal pain and generalized weakness of several days duration.  She denies vomiting, but reports diarrhea.  She reports a mild cough.  No difficulty breathing no chest pain no lower extremity swelling. ?Patient is on chemotherapy currently. ? ?ED Course: Temperature 103.3.  Heart rate 107-140.  Respiratory rate 12-20.  Blood pressure systolic 323F to 573U.  O2 sats greater than 95% on room air.  Portable chest x-ray without acute abnormality.  CT abdomen and pelvis with contrast-suggest active inflammatory or infectious enteritis.  Chest x-ray without acute abnormality. ?WBC 0.3, with absolute neutrophil count of 0.1.  Normal lactic acid 1.4. ?IV vancomycin cefepime and metronidazole started.  1 L bolus given in ED. ?Hospitalist admit for febrile neutropenia. ? ?Review of Systems: As per HPI all other systems reviewed and negative. ? ?Past Medical History:  ?Diagnosis Date  ? Anxiety   ? Arthritis   ? "spine; neck thru lower back" (07/21/2012)  ? Chronic lower back pain   ? Daily headache   ? Elevated TSH   ? a. Noted 07/2012, to f/u pcp.  ? H/O cardiac catheterization   ? No angiographic evidence of CAD 07/2012  ? High cholesterol   ? Hypertension   ? Migraines   ? "much better since I went on the BP medicine" (07/21/2012)  ? PONV (postoperative nausea and vomiting)   ? ? ?Past Surgical History:  ?Procedure Laterality Date  ? ABDOMINAL HYSTERECTOMY  1980's  ? ANTERIOR CERVICAL DECOMP/DISCECTOMY FUSION  2002?  ? APPENDECTOMY  1980's  ?  "w/hysterectomy" (07/21/2012)  ? BILATERAL OOPHORECTOMY  1980's  ? "2 years after hysterectomy" (07/21/12)  ? CHOLECYSTECTOMY  2006?  ? LEFT HEART CATHETERIZATION WITH CORONARY ANGIOGRAM N/A 07/22/2012  ? Procedure: LEFT HEART CATHETERIZATION WITH CORONARY ANGIOGRAM;  Surgeon: Burnell Blanks, MD;  Location: [facility] CATH LAB;  Service: Cardiovascular;  Laterality: N/A;  ? POSTERIOR LUMBAR FUSION  09/2010  ? TONSILLECTOMY  1954  ? TUBAL LIGATION  1980  ? ? ? reports that she has never smoked. She has never used smokeless tobacco. She reports that she does not drink alcohol and does not use drugs. ? ?Allergies  ?Allergen Reactions  ? Levaquin [Levofloxacin In D5w] Nausea And Vomiting  ? Morphine And Related Nausea And Vomiting  ? Prednisolone Acetate   ?  Liquid medication.  Made heart race and couldn't catch breath  ? ? ?Family History  ?Problem Relation Age of Onset  ? Heart attack Mother 40  ? ?Prior to Admission medications   ?Medication Sig Start Date End Date Taking? Authorizing Provider  ?ALPRAZolam (XANAX) 0.5 MG tablet Take 0.5 mg by mouth at bedtime.   Yes [provider]  ?cyclobenzaprine (FLEXERIL) 10 MG tablet Take 10 mg by mouth at bedtime.   Yes [provider]  ?dicyclomine (BENTYL) 10 MG capsule Take 10 mg by mouth 4 (four) times daily - after meals and at bedtime. 06/17/21  Yes [provider]  ?estradiol (ESTRACE) 1 MG tablet Take 1 mg by mouth daily.   Yes [provider]  ?lisinopril (ZESTRIL) 10 MG tablet Take 10 mg by mouth daily. 05/06/21  Yes [provider]  ?metoprolol succinate (TOPROL-XL) 25 MG 24 hr tablet Take 25 mg by mouth 2 (two) times daily.   Yes [provider]  ?omeprazole (PRILOSEC) 20 MG capsule Take 20 mg by mouth 2 (two) times daily. 06/04/21  Yes [provider]  ?SALINE MIST SPRAY NA Place 1 spray into the nose as needed (congestion).   Yes [provider]  ?methylPREDNISolone (MEDROL DOSEPAK) 4 MG TBPK  tablet Take by mouth daily. Day 1:  2 pills at breakfast, 1 pill at lunch, 1 pill after supper, 2 pills at bedtime;Day 2:  1 pill at breakfast, 1 pill at lunch, 1 pill after supper, 2 pills at bedtime;Day 3:  1 pill at breakfast, 1 pill at lunch, 1 pill after supper, 1 pill at bedtime;Day 4:  1 pill at breakfast, 1 pill at lunch, 1 pill at bedtime;Day 5:  1 pill at breakfast, 1 pill at bedtime;Day 6:  1 pill at breakfast ?Patient not taking: Reported on 12/18/2020 11/26/20   Isla Pence, MD  ?ondansetron (ZOFRAN ODT) 4 MG disintegrating tablet Take 1 tablet (4 mg total) by mouth every 8 (eight) hours as needed for nausea or vomiting. ?Patient not taking: Reported on 12/18/2020 11/26/20   Isla Pence, MD  ? ? ?Physical Exam: ?Vitals:  ? 06/26/21 1330 06/26/21 1400 06/26/21 1405 06/26/21 1500  ?BP: 120/69 (!) 137/51  (!) 143/50  ?Pulse: (!) 140 (!) 138  (!) 125  ?Resp:  20  16  ?Temp:   (!) 103.3 ?F (39.6 ?C)   ?TempSrc:   Oral   ?SpO2: 99% 98%  95%  ?Weight:      ?Height:      ? ? ?Constitutional: NAD, calm, comfortable ?Vitals:  ? 06/26/21 1330 06/26/21 1400 06/26/21 1405 06/26/21 1500  ?BP: 120/69 (!) 137/51  (!) 143/50  ?Pulse: (!) 140 (!) 138  (!) 125  ?Resp:  20  16  ?Temp:   (!) 103.3 ?F (39.6 ?C)   ?TempSrc:   Oral   ?SpO2: 99% 98%  95%  ?Weight:      ?Height:      ? ?Eyes: PERRL, lids and conjunctivae normal ?ENMT: Mucous membranes are dry. ?Neck: normal, supple, no masses, no thyromegaly ?Respiratory: clear to auscultation bilaterally, no wheezing, no crackles. Normal respiratory effort. No accessory muscle use.  ?Cardiovascular: Tachycardic, regular rate and rhythm, no murmurs / rubs / gallops. No extremity edema.  Lower extremities warm.  Port looks clean without erythema or drainage. ?Abdomen: no tenderness, no masses palpated. No hepatosplenomegaly. Bowel sounds positive.  ?Musculoskeletal: no clubbing / cyanosis. No joint deformity upper and lower extremities.  ?Skin: no rashes, lesions, ulcers.  No induration ?Neurologic: No apparent abnormality moving extremities spontaneously.  ?Psychiatric: Normal judgment and insight. Alert and oriented x 3. Normal mood.  ? ?Labs on Admission: I have personally reviewed following labs and imaging studies ? ?CBC: ?Recent Labs  ?Lab 06/26/21 ?1219  ?WBC 0.3*  ?NEUTROABS 0.1*  ?HGB 8.4*  ?HCT 25.1*  ?MCV 87.8  ?PLT 62*  ? ?Basic Metabolic Panel: ?Recent Labs  ?Lab 06/26/21 ?1219  ?NA 137  ?K 3.3*  ?CL 105  ?CO2 24  ?GLUCOSE 131*  ?BUN 14  ?CREATININE 0.99  ?CALCIUM 7.1*  ? ?GFR: ?Estimated Creatinine Clearance: 46.7 mL/min (by C-G formula based on SCr of 0.99 mg/dL). ?Liver Function Tests: ?Recent Labs  ?Lab 06/26/21 ?1219  ?AST  16  ?ALT 13  ?ALKPHOS 56  ?BILITOT 0.6  ?PROT 5.3*  ?ALBUMIN 2.4*  ? ?Coagulation Profile: ?Recent Labs  ?Lab 06/26/21 ?1219  ?INR 1.3*  ? ? ?Radiological Exams on Admission: ?CT ABDOMEN PELVIS W CONTRAST ? ?Result Date: 06/26/2021 ?CLINICAL DATA:  Sepsis, abdominal pain, fever EXAM: CT ABDOMEN AND PELVIS WITH CONTRAST TECHNIQUE: Multidetector CT imaging of the abdomen and pelvis was performed using the standard protocol following bolus administration of intravenous contrast. RADIATION DOSE REDUCTION: This exam was performed according to the departmental dose-optimization program which includes automated exposure control, adjustment of the mA and/or kV according to patient size and/or use of iterative reconstruction technique. CONTRAST:  187m OMNIPAQUE IOHEXOL 300 MG/ML  SOLN COMPARISON:  None. FINDINGS: Lower chest: Linear density in the medial segment of right middle lobe may suggest subsegmental atelectasis. Hepatobiliary: No focal abnormality is seen in the liver. Surgical clips are seen in gallbladder fossa. Pancreas: No focal abnormality is seen. There is mild dilation of pancreatic duct. Spleen: Unremarkable. Adrenals/Urinary Tract: There is mild hyperplasia of left adrenal. There is no hydronephrosis. There are no renal or ureteral stones.  Urinary bladder is unremarkable. Stomach/Bowel: Stomach is unremarkable. Proximal small bowel loops are unremarkable. There is diffuse wall thickening in the distal and terminal ileum in the lower abdomen. Wall thickness

## 2021-06-26 NOTE — ED Provider Notes (Signed)
? ?Emergency Department Provider Note ? ? ?I have reviewed the triage vital signs and the nursing notes. ? ? ?HISTORY ? ?Chief Complaint ?Abdominal Pain ? ? ?HPI ?CEDRIC DENISON is a 76 y.o. female with past medical history reviewed below including squamous cell carcinoma of the anus, followed at [facility], presents with fever and abdominal pain. Patient describes weakness and diarrhea in additional to diffuse abdominal pain. Noted fever last night and this AM. Positive nausea as well. Notes some mild cough. No SOB. No HA.  ? ?Current chemotherapy: mitomycin/5-FU  ? ? ?Past Medical History:  ?Diagnosis Date  ? Anxiety   ? Arthritis   ? "spine; neck thru lower back" (07/21/2012)  ? Chronic lower back pain   ? Daily headache   ? Elevated TSH   ? a. Noted 07/2012, to f/u pcp.  ? H/O cardiac catheterization   ? No angiographic evidence of CAD 07/2012  ? High cholesterol   ? Hypertension   ? Migraines   ? "much better since I went on the BP medicine" (07/21/2012)  ? PONV (postoperative nausea and vomiting)   ? ? ?Review of Systems ? ?Constitutional: Positive fever/chills and weakness.  ?Eyes: No visual changes. ?ENT: No sore throat. ?Cardiovascular: Denies chest pain. ?Respiratory: Denies shortness of breath. ?Gastrointestinal: Positive abdominal pain. Positive nausea, no vomiting.  Positive diarrhea.  No constipation. ?Genitourinary: Negative for dysuria. ?Musculoskeletal: Negative for back pain. ?Skin: Negative for rash. ?Neurological: Negative for headaches, focal weakness or numbness. ? ? ?____________________________________________ ? ? ?PHYSICAL EXAM: ? ?VITAL SIGNS: ?ED Triage Vitals  ?Enc Vitals Group  ?   BP 06/26/21 1205 (!) 148/57  ?   Pulse Rate 06/26/21 1205 (!) 127  ?   Resp 06/26/21 1205 20  ?   Temp 06/26/21 1205 (!) 103 ?F (39.4 ?C)  ?   Temp Source 06/26/21 1205 Oral  ?   SpO2 06/26/21 1205 100 %  ?   Weight 06/26/21 1202 135 lb (61.2 kg)  ?   Height 06/26/21 1202 '5\' 6"'$  (1.676 m)  ? ?Constitutional: Alert  and oriented. Well appearing and in no acute distress. ?Eyes: Conjunctivae are normal.  ?Head: Atraumatic. ?Nose: No congestion/rhinnorhea. ?Mouth/Throat: Mucous membranes are moist.   ?Neck: No stridor.  ?Cardiovascular: Tachycardia. Good peripheral circulation. Grossly normal heart sounds.   ?Respiratory: Normal respiratory effort.  No retractions. Lungs CTAB. ?Gastrointestinal: Soft with diffuse tenderness, worse on the right. Mild distention.  ?Musculoskeletal: No lower extremity tenderness nor edema. No gross deformities of extremities. ?Neurologic:  Normal speech and language. No gross focal neurologic deficits are appreciated.  ?Skin:  Skin is warm, dry and intact. No rash noted. ? ? ?____________________________________________ ?  ?LABS ?(all labs ordered are listed, but only abnormal results are displayed) ? ?Labs Reviewed  ?COMPREHENSIVE METABOLIC PANEL - Abnormal; Notable for the following components:  ?    Result Value  ? Potassium 3.3 (*)   ? Glucose, Bld 131 (*)   ? Calcium 7.1 (*)   ? Total Protein 5.3 (*)   ? Albumin 2.4 (*)   ? GFR, Estimated 60 (*)   ? All other components within normal limits  ?CBC WITH DIFFERENTIAL/PLATELET - Abnormal; Notable for the following components:  ? WBC 0.3 (*)   ? RBC 2.86 (*)   ? Hemoglobin 8.4 (*)   ? HCT 25.1 (*)   ? RDW 16.9 (*)   ? Platelets 62 (*)   ? Neutro Abs 0.1 (*)   ? Lymphs Abs 0.1 (*)   ?  All other components within normal limits  ?PROTIME-INR - Abnormal; Notable for the following components:  ? Prothrombin Time 15.6 (*)   ? INR 1.3 (*)   ? All other components within normal limits  ?CULTURE, BLOOD (ROUTINE X 2)  ?CULTURE, BLOOD (ROUTINE X 2)  ?RESP PANEL BY RT-PCR (FLU A&B, COVID) ARPGX2  ?LACTIC ACID, PLASMA  ?URINALYSIS, ROUTINE W REFLEX MICROSCOPIC  ? ?____________________________________________ ? ?RADIOLOGY ? ?CT ABDOMEN PELVIS W CONTRAST ? ?Result Date: 06/26/2021 ?CLINICAL DATA:  Sepsis, abdominal pain, fever EXAM: CT ABDOMEN AND PELVIS WITH  CONTRAST TECHNIQUE: Multidetector CT imaging of the abdomen and pelvis was performed using the standard protocol following bolus administration of intravenous contrast. RADIATION DOSE REDUCTION: This exam was performed according to the departmental dose-optimization program which includes automated exposure control, adjustment of the mA and/or kV according to patient size and/or use of iterative reconstruction technique. CONTRAST:  12m OMNIPAQUE IOHEXOL 300 MG/ML  SOLN COMPARISON:  None. FINDINGS: Lower chest: Linear density in the medial segment of right middle lobe may suggest subsegmental atelectasis. Hepatobiliary: No focal abnormality is seen in the liver. Surgical clips are seen in gallbladder fossa. Pancreas: No focal abnormality is seen. There is mild dilation of pancreatic duct. Spleen: Unremarkable. Adrenals/Urinary Tract: There is mild hyperplasia of left adrenal. There is no hydronephrosis. There are no renal or ureteral stones. Urinary bladder is unremarkable. Stomach/Bowel: Stomach is unremarkable. Proximal small bowel loops are unremarkable. There is diffuse wall thickening in the distal and terminal ileum in the lower abdomen. Wall thickness in the involved segment measures up to 10 mm. There is mild stranding in the adjacent mesenteric fat. There is no loculated fluid collection in the mesentery. There is fluid in the lumen of small bowel loops and right colon. Appendix is not distinctly seen. There is no significant wall thickening in colon. There is no pericolic stranding. Vascular/Lymphatic: Scattered atherosclerotic plaques and calcifications are seen in the aorta and its major branches. Reproductive: Uterus is not seen.  There are no adnexal masses. Other: There is no ascites or pneumoperitoneum. Small umbilical hernia containing fat is seen. Musculoskeletal: There is surgical fusion at L5-S1 level. IMPRESSION: There is abnormal wall thickening in the Parmvir Boomer segment of distal and terminal ileum  in the lower abdomen. There is inflammatory stranding in the adjacent mesentery without any loculated fluid collections. Findings suggest active inflammatory or infectious enteritis. There is no evidence intestinal obstruction or pneumoperitoneum. There is no hydronephrosis. Other findings as described in the body of the report. Electronically Signed   By: PElmer PickerM.D.   On: 06/26/2021 15:07  ? ?DG Chest Port 1 View ? ?Result Date: 06/26/2021 ?CLINICAL DATA:  Suspected sepsis with fever and history of cancer. EXAM: PORTABLE CHEST 1 VIEW COMPARISON:  Chest x-ray Jul 21, 2012. FINDINGS: No consolidation. No visible pleural effusions or pneumothorax. Cardiomediastinal silhouette is within normal limits. Partially imaged ACDF. IMPRESSION: No evidence of acute cardiopulmonary disease. Electronically Signed   By: FMargaretha SheffieldM.D.   On: 06/26/2021 12:42   ? ?____________________________________________ ? ? ?PROCEDURES ? ?Procedure(s) performed:  ? ?.Critical Care ?Performed by: LMargette Fast MD ?Authorized by: LMargette Fast MD  ? ?Critical care provider statement:  ?  Critical care time (minutes):  45 ?  Critical care time was exclusive of:  Separately billable procedures and treating other patients and teaching time ?  Critical care was necessary to treat or prevent imminent or life-threatening deterioration of the following conditions:  Sepsis ?  Critical care was time spent personally by me on the following activities:  Development of treatment plan with patient or surrogate, discussions with consultants, evaluation of patient's response to treatment, examination of patient, ordering and review of laboratory studies, ordering and review of radiographic studies, ordering and performing treatments and interventions, pulse oximetry, re-evaluation of patient's condition, review of old charts, blood draw for specimens and obtaining history from patient or surrogate ?  I assumed direction of critical care  for this patient from another provider in my specialty: no   ?  Care discussed with: admitting provider   ? ? ?____________________________________________ ? ? ?INITIAL IMPRESSION / ASSESSMENT AND PLAN /

## 2021-06-26 NOTE — Assessment & Plan Note (Addendum)
Stable. ?-Hold 10 mg lisinopril for now with contrast exposure and to allow BP margin for metoprolol titration>>restart 4/25 ?

## 2021-06-26 NOTE — Progress Notes (Signed)
Pharmacy Antibiotic Note ? ?Jenny Martinez is a 76 y.o. female admitted on 06/26/2021 with  unknown source .  Pharmacy has been consulted for cefepime and vancomycin dosing. ?Patient with rectal CA at [facility], leceiving Mitomycin + 5 FU 96 hr home infusion. Patient with noted chemotherapy induced neutropenia at 06/02/21 visit. ANC 100 today. Empiric tx with broad spectrum abx ? ?Plan: ?Vancomycin '1250mg'$  IV loading dose then 1000 mg IV Q 24 hrs. Goal AUC 400-550. ?Expected AUC: 511 ?SCr used: 0.99  ?Cefepime 2gm IV q12h ?F/u cxs and clinical progress ?Monitor V/S, labs and levels as indicated ? ?Height: '5\' 6"'$  (167.6 cm) ?Weight: 61.2 kg (135 lb) ?IBW/kg (Calculated) : 59.3 ? ?Temp (24hrs), Avg:103.2 ?F (39.6 ?C), Min:103 ?F (39.4 ?C), Max:103.3 ?F (39.6 ?C) ? ?Recent Labs  ?Lab 06/26/21 ?1219  ?WBC 0.3*  ?CREATININE 0.99  ?LATICACIDVEN 1.4  ?  ?Estimated Creatinine Clearance: 46.7 mL/min (by C-G formula based on SCr of 0.99 mg/dL).   ? ?Allergies  ?Allergen Reactions  ? Levaquin [Levofloxacin In D5w] Nausea And Vomiting  ? Morphine And Related Nausea And Vomiting  ? Prednisolone Acetate   ?  Liquid medication.  Made heart race and couldn't catch breath  ? ? ?Antimicrobials this admission: ?vancomycin 4/20 >>  ?cefepime 4/20 >>   ?Metronidazole 4/20 IV x 1 in ED ? ?Microbiology results: ?4/20 BCx: pending ? MRSA PCR:  ? ?Thank you for allowing pharmacy to be a part of this patient?s care. ? ?Isac Sarna, BS Pharm D, BCPS ?Clinical Pharmacist ?06/26/2021 2:30 PM ? ?

## 2021-06-26 NOTE — Assessment & Plan Note (Addendum)
Tachycardic, febrile, with leukopenia.  Etiology likely gastrointestinal  with , CT suggesting enteritis.  Chest x-ray clear.  UA not suggestive of infection.  Port Area looks clean without erythema or drainage. ?-Broad-spectrum antibiotics ?-Hydrate ?-Follow-up cultures ?

## 2021-06-26 NOTE — Assessment & Plan Note (Addendum)
Diagnosed squamous cell carcinoma of the anus Feb 2023 ?- follows with heme-onc and radiation oncology at [facility]--Dr. Georges Lynch ?She is on chemoradiation therapy.   ?chemoinfusion pump removed 06/20/21 after last dose  ? ?

## 2021-06-27 DIAGNOSIS — K529 Noninfective gastroenteritis and colitis, unspecified: Secondary | ICD-10-CM

## 2021-06-27 DIAGNOSIS — A419 Sepsis, unspecified organism: Secondary | ICD-10-CM | POA: Diagnosis not present

## 2021-06-27 DIAGNOSIS — D61818 Other pancytopenia: Secondary | ICD-10-CM | POA: Diagnosis not present

## 2021-06-27 DIAGNOSIS — D709 Neutropenia, unspecified: Secondary | ICD-10-CM | POA: Diagnosis not present

## 2021-06-27 DIAGNOSIS — R339 Retention of urine, unspecified: Secondary | ICD-10-CM

## 2021-06-27 LAB — CBC WITH DIFFERENTIAL/PLATELET
Abs Immature Granulocytes: 0.03 10*3/uL (ref 0.00–0.07)
Basophils Absolute: 0 10*3/uL (ref 0.0–0.1)
Basophils Relative: 0 %
Eosinophils Absolute: 0 10*3/uL (ref 0.0–0.5)
Eosinophils Relative: 4 %
HCT: 25.1 % — ABNORMAL LOW (ref 36.0–46.0)
Hemoglobin: 8.4 g/dL — ABNORMAL LOW (ref 12.0–15.0)
Immature Granulocytes: 12 %
Lymphocytes Relative: 20 %
Lymphs Abs: 0.1 10*3/uL — ABNORMAL LOW (ref 0.7–4.0)
MCH: 29.4 pg (ref 26.0–34.0)
MCHC: 33.5 g/dL (ref 30.0–36.0)
MCV: 87.8 fL (ref 80.0–100.0)
Monocytes Absolute: 0.1 10*3/uL (ref 0.1–1.0)
Monocytes Relative: 40 %
Neutro Abs: 0.1 10*3/uL — CL (ref 1.7–7.7)
Neutrophils Relative %: 24 %
Platelets: 62 10*3/uL — ABNORMAL LOW (ref 150–400)
RBC: 2.86 MIL/uL — ABNORMAL LOW (ref 3.87–5.11)
RDW: 16.9 % — ABNORMAL HIGH (ref 11.5–15.5)
WBC: 0.3 10*3/uL — CL (ref 4.0–10.5)
nRBC: 0 % (ref 0.0–0.2)

## 2021-06-27 LAB — BASIC METABOLIC PANEL
Anion gap: 6 (ref 5–15)
BUN: 12 mg/dL (ref 8–23)
CO2: 22 mmol/L (ref 22–32)
Calcium: 6.9 mg/dL — ABNORMAL LOW (ref 8.9–10.3)
Chloride: 109 mmol/L (ref 98–111)
Creatinine, Ser: 0.85 mg/dL (ref 0.44–1.00)
GFR, Estimated: >60 mL/min (ref >60)
Glucose, Bld: 102 mg/dL — ABNORMAL HIGH (ref 70–99)
Potassium: 4.5 mmol/L (ref 3.5–5.1)
Sodium: 137 mmol/L (ref 135–145)

## 2021-06-27 LAB — CBC
HCT: 22.5 % — ABNORMAL LOW (ref 36.0–46.0)
Hemoglobin: 7.5 g/dL — ABNORMAL LOW (ref 12.0–15.0)
MCH: 29.4 pg (ref 26.0–34.0)
MCHC: 33.3 g/dL (ref 30.0–36.0)
MCV: 88.2 fL (ref 80.0–100.0)
Platelets: 38 10*3/uL — ABNORMAL LOW (ref 150–400)
RBC: 2.55 MIL/uL — ABNORMAL LOW (ref 3.87–5.11)
RDW: 17 % — ABNORMAL HIGH (ref 11.5–15.5)
WBC: 0.3 10*3/uL — CL (ref 4.0–10.5)
nRBC: 0 % (ref 0.0–0.2)

## 2021-06-27 MED ORDER — PROCHLORPERAZINE EDISYLATE 10 MG/2ML IJ SOLN
10.0000 mg | Freq: Four times a day (QID) | INTRAMUSCULAR | Status: DC | PRN
  Administered 2021-06-27 – 2021-07-02 (×3): 10 mg via INTRAVENOUS
  Filled 2021-06-27 (×3): qty 2

## 2021-06-27 MED ORDER — SODIUM CHLORIDE 0.9 % IV SOLN
INTRAVENOUS | Status: DC
Start: 2021-06-27 — End: 2021-06-30

## 2021-06-27 MED ORDER — SODIUM CHLORIDE 0.9 % IV SOLN: 1.0000 g | Freq: Three times a day (TID) | INTRAVENOUS | Status: DC

## 2021-06-27 MED ORDER — BOOST / RESOURCE BREEZE PO LIQD CUSTOM
1.0000 | Freq: Three times a day (TID) | ORAL | Status: DC
  Administered 2021-06-29 – 2021-07-01 (×3): 1 via ORAL

## 2021-06-27 MED ORDER — SODIUM CHLORIDE 0.9 % IV SOLN
1.0000 g | Freq: Three times a day (TID) | INTRAVENOUS | Status: DC
  Administered 2021-06-27 – 2021-07-02 (×16): 1 g via INTRAVENOUS
  Filled 2021-06-27 (×17): qty 20

## 2021-06-27 MED ORDER — TRAMADOL HCL 50 MG PO TABS
50.0000 mg | ORAL_TABLET | Freq: Four times a day (QID) | ORAL | Status: DC | PRN
Start: 2021-06-27 — End: 2021-07-02
  Administered 2021-06-27: 50 mg via ORAL
  Filled 2021-06-27: qty 1

## 2021-06-27 MED ORDER — TBO-FILGRASTIM 300 MCG/0.5ML ~~LOC~~ SOSY
300.0000 ug | PREFILLED_SYRINGE | Freq: Every day | SUBCUTANEOUS | Status: DC
Start: 2021-06-27 — End: 2021-06-30
  Administered 2021-06-27 – 2021-06-29 (×3): 300 ug via SUBCUTANEOUS
  Filled 2021-06-27 (×7): qty 0.5

## 2021-06-27 MED ORDER — TBO-FILGRASTIM 300 MCG/0.5ML ~~LOC~~ SOSY: 300.0000 ug | PREFILLED_SYRINGE | Freq: Once | SUBCUTANEOUS | Status: DC

## 2021-06-27 NOTE — Hospital Course (Addendum)
76 year old female with a history of hypertension and anal squamous cell carcinoma presenting with fevers, chills, and abdominal pain that started on 06/24/2021.  The patient had her chemotherapy infusion pump removed on 06/20/2021.  She has been receiving mitomycin and 5-FU since her diagnosis of anal squamous cell carcinoma noted on colonoscopy on 04/17/2021.  She has also been receiving radiation on a daily basis.  She was due to finish radiation on the first week of May 2023.  Nevertheless, the patient received IV fluids and potassium infusion on 06/24/2021 at the cancer center at [facility]   That evening, she began having high fevers up to 104.0 ?F with abdominal pain.  She denies any headache, chest pain, shortness of breath, hemoptysis, vomiting.  She has had loose stools that have slightly worsened since chemotherapy.  There is no hematochezia or melena.  She denies any dysuria or hematuria. ?In the ED, the patient had a fever up to 103.3 ?F.  She was hemodynamically stable but tachycardic into the 130s.  Oxygen saturation was 98% on room air.  WBC 0.3, hemoglobin 8.4, platelets 62,000.  Sodium 137, potassium 3.3, bicarbonate 24, serum creatinine 0.99.  UA was negative for pyuria.  CT of the abdomen and pelvis showed abnormal wall thickening in the distal and terminal ileum.  There is also adjacent inflammatory stranding without loculated fluid collection.  There is no obstruction or hydronephrosis.  The patient was initially started on vancomycin, cefepime, and metronidazole.  She was admitted for further evaluation and treatment of her neutropenic fever.Marland Kitchen ?

## 2021-06-27 NOTE — TOC Progression Note (Signed)
?  Transition of Care (TOC) Screening Note ? ? ?Patient Details  ?Name: Jenny Martinez ?Date of Birth: Jul 08, 1945 ? ? ?Transition of Care (TOC) CM/SW Contact:    ?Boneta Lucks, RN ?Phone Number: ?06/27/2021, 10:27 AM ? ?Follow for DC on Monday ? ?Transition of Care Department [facility]) has reviewed patient and no TOC needs have been identified at this time. We will continue to monitor patient advancement through interdisciplinary progression rounds. If new patient transition needs arise, please place a TOC consult. ? ? ?Expected Discharge Plan: Home/Self Care ?Barriers to Discharge: Continued Medical Work up ? ?Expected Discharge Plan and Services ?Expected Discharge Plan: Home/Self Care ?  ?  ?   ?

## 2021-06-27 NOTE — Progress Notes (Signed)
Initial Nutrition Assessment ? ?DOCUMENTATION CODES:  ? ?  ? ?INTERVENTION:  ?Boost Breeze po TID  ? ?Recommend regular diet (when pt cleared to advance) ? ?NUTRITION DIAGNOSIS:  ? ?Increased nutrient needs related to cancer and cancer related treatments as evidenced by estimated needs. ? ? ?GOAL:  ?Patient will meet greater than or equal to 90% of their needs ? ? ?MONITOR:  ?Diet advancement, PO intake, Supplement acceptance, Labs, Weight trends ? ?REASON FOR ASSESSMENT:  ? ?Malnutrition Screening Tool ?  ? ?ASSESSMENT: Patient is a 76 yo female with hx of squamous cell carcinoma who is receiving treatment (mitomycin and 5-FU). Presents with fever and abdominal pain.  ? ?No meals documented. Clear liquids. Patient is fully dressed and awake. Minimal intake today. We talked about sipping the Boost Breeze mixed with a diet carbonated beverage to decrease the "sweetness" between meals.  ? ?Home diet regular but appetite has been poor since chemotherapy treatments. High risk for malnutrition.  ? ?Medications reviewed.  ? ?Weight history stable at 65.2 kg compared to weight 7 months ago.   ? ?IVF- NS@ 125 ml/hr, and antibiotics ? ?Labs: ? ?  Latest Ref Rng & Units 06/27/2021  ?  5:42 AM 06/26/2021  ? 12:19 PM 07/23/2012  ?  5:30 AM  ?BMP  ?Glucose 70 - 99 mg/dL 102   131   115    ?BUN 8 - 23 mg/dL '12   14   12    '$ ?Creatinine 0.44 - 1.00 mg/dL 0.85   0.99   1.00    ?Sodium 135 - 145 mmol/L 137   137   140    ?Potassium 3.5 - 5.1 mmol/L 4.5   3.3   3.6    ?Chloride 98 - 111 mmol/L 109   105   106    ?CO2 22 - 32 mmol/L '22   24   25    '$ ?Calcium 8.9 - 10.3 mg/dL 6.9   7.1   8.2    ?   ? ?NUTRITION - FOCUSED PHYSICAL EXAM: ?Deferred to follow up.  ? ? ?Diet Order:   ?Diet Order   ? ?       ?  Diet clear liquid Room service appropriate? Yes; Fluid consistency: Thin  Diet effective now       ?  ? ?  ?  ? ?  ? ? ?EDUCATION NEEDS:  ?Education needs have been addressed ? ?Skin:  Skin Assessment: Reviewed RN Assessment ? ?Last BM:   4/19 ? ?Height:  ? ?Ht Readings from Last 1 Encounters:  ?06/26/21 '5\' 6"'$  (1.676 m)  ? ? ?Weight:  ? ?Wt Readings from Last 1 Encounters:  ?06/26/21 65.2 kg  ? ? ?Ideal Body Weight:   59 kg ? ?BMI:  Body mass index is 23.2 kg/m?. ? ?Estimated Nutritional Needs:  ? ?Kcal:  2000-2100 ? ?Protein:  90-95 gr ? ?Fluid:  >1600 ml daily ? ?Colman Cater MS,RD,CSG,LDN ?Contact: AMION ?

## 2021-06-27 NOTE — Assessment & Plan Note (Addendum)
Neutropenic enteritis ?Presented with fever and tachycardia and neutropenia ?Continue IV fluids ?continue meropenem ?Follow blood cultures--neg to date ?Urine culture neg ?Personally reviewed chest x-ray--no infiltrates or edema ?Remains tachycardic but much improved;  No fever x 48 hours ?--personally reviewed CXR 4/25--bibasilar atelectatsis ?

## 2021-06-27 NOTE — Progress Notes (Addendum)
?  ?       ?PROGRESS NOTE ? ?Jenny Martinez OEU:[id number] DOB: 1945/09/30 DOA: 06/26/2021 ?PCP: Jettie Booze, NP ? ?Brief History:  ?76 year old female with a history of hypertension and anal squamous cell carcinoma presenting with fevers, chills, and abdominal pain that started on 06/24/2021.  The patient had her chemotherapy infusion pump removed on 06/20/2021.  She has been receiving mitomycin and 5-FU since her diagnosis of anal squamous cell carcinoma noted on colonoscopy on 04/17/2021.  She has also been receiving radiation on a daily basis.  She was due to finish radiation on the first week of May 2023.  Nevertheless, the patient received IV fluids and potassium infusion on 06/24/2021 at the cancer center at [facility].   That evening, she began having high fevers up to 104.0 ?F with abdominal pain.  She denies any headache, chest pain, shortness of breath, hemoptysis, vomiting.  She has had loose stools that have slightly worsened since chemotherapy.  There is no hematochezia or melena.  She denies any dysuria or hematuria. ?In the ED, the patient had a fever up to 103.3 ?F.  She was hemodynamically stable but tachycardic into the 130s.  Oxygen saturation was 98% on room air.  WBC 0.3, hemoglobin 8.4, platelets 62,000.  Sodium 137, potassium 3.3, bicarbonate 24, serum creatinine 0.99.  UA was negative for pyuria.  CT of the abdomen and pelvis showed abnormal wall thickening in the distal and terminal ileum.  There is also adjacent inflammatory stranding without loculated fluid collection.  There is no obstruction or hydronephrosis.  The patient was initially started on vancomycin, cefepime, and metronidazole.  She was admitted for further evaluation and treatment of her neutropenic fever.  ? ? ?Assessment and Plan: ?* Febrile neutropenia ([place]) ?ANC = 108 at time of admission ?Etiology is likely neutropenic enteritis ?Continue IV fluids ?Continue empiric vancomycin ?Start IV meropenem ?UA negative for  pyuria ?Chest x-ray negative for infiltrates ?Follow blood cultures and urine cultures ?Start G-CSF ? ?Sepsis due to undetermined organism [facility]) ?Neutropenic enteritis ?Presented with fever and tachycardia and neutropenia ?Continue IV fluids ?Continue vancomycin ?Start meropenem ?Follow blood cultures ?Personally reviewed chest x-ray--no infiltrates or edema ? ?Enteritis ?Neutropenic Enteritis ?4/20 CT abd--abnormal wall thickening in the distal and terminal ileum with adjacent inflammatory stranding ?Start meropenem ?Stool pathogen panel ?Check C. difficile ? ?Pancytopenia ([place]) ?Pancytopenia with leukopenia of 0.3, thrombocytopenia of 62, and anemia hemoglobin 8.4-baseline 11-13.   ?Likely secondary to chemotherapy-she is on mitomycin and 5-FU. ? 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 Georges Lynch ?She is on chemoradiation therapy.   ?chemoinfusion pump removed 06/20/21 ? ? ?Urine retention ?Foley placed in ED ?1000cc out with foley insertion ?Plan to d/c foley for voiding trial 4/22 ? ?Hypertension ?Stable. ?-Hold 10 mg lisinopril for now with contrast exposure ? ? ? ? ? ? ?Family Communication:   spouse updated at bedside 4/21 ? ?Consultants:  none ? ?Code Status:  FULL / ? ?DVT Prophylaxis:  [place] Heparin /  Lovenox ? ? ?Procedures: ?As Listed in Progress Note Above ? ?Antibiotics: ?Merrem 4/21>> ?Vanc 4/21>> ? ? ? ? ? ?Subjective: ?Patient complains of abdominal pain.  She has had a small amounts of loose stool.  She denies any chest pain, shortness breath, hemoptysis, vomiting, hematochezia, melena.  She denies any headache or neck pain. ? ?Objective: ?Vitals:  ? 06/27/21 0650 06/27/21 0730 06/27/21 0736 06/27/21 0939  ?  BP:  (!) 144/54  (!) 162/60  ?Pulse: (!) 128 (!) 123 (!) 122 (!) 125  ?Resp:    18  ?Temp: (!) 101.4 ?F (38.6 ?C) 100.3 ?F (37.9 ?C)  (!) 100.4 ?F (38 ?C)  ?TempSrc:  Oral  Oral  ?SpO2:  96%   97%  ?Weight:      ?Height:      ? ? ?Intake/Output Summary (Last 24 hours) at 06/27/2021 1046 ?Last data filed at 06/27/2021 0549 ?Gross per 24 hour  ?Intake 754.51 ml  ?Output 1500 ml  ?Net -745.49 ml  ? ?Weight change:  ?Exam: ? ?General:  Pt is alert, follows commands appropriately, not in acute distress ?HEENT: No icterus, No thrush, No neck mass, [place]/AT ?Cardiovascular: RRR, S1/S2, no rubs, no gallops ?Respiratory: CTA bilaterally, no wheezing, no crackles, no rhonchi ?Abdomen: Soft/+BS, diffusely tender, non distended, + guarding ?Extremities: No edema, No lymphangitis, No petechiae, No rashes, no synovitis ? ? ?Data Reviewed: ?I have personally reviewed following labs and imaging studies ?Basic Metabolic Panel: ?Recent Labs  ?Lab 06/26/21 ?1219 06/27/21 ?0542  ?NA 137 137  ?K 3.3* 4.5  ?CL 105 109  ?CO2 24 22  ?GLUCOSE 131* 102*  ?BUN 14 12  ?CREATININE 0.99 0.85  ?CALCIUM 7.1* 6.9*  ? ?Liver Function Tests: ?Recent Labs  ?Lab 06/26/21 ?1219  ?AST 16  ?ALT 13  ?ALKPHOS 56  ?BILITOT 0.6  ?PROT 5.3*  ?ALBUMIN 2.4*  ? ?No results for input(s): LIPASE, AMYLASE in the last 168 hours. ?No results for input(s): AMMONIA in the last 168 hours. ?Coagulation Profile: ?Recent Labs  ?Lab 06/26/21 ?1219  ?INR 1.3*  ? ?CBC: ?Recent Labs  ?Lab 06/26/21 ?1219 06/27/21 ?0542  ?WBC 0.3* 0.3*  ?NEUTROABS 0.1*  --   ?HGB 8.4* 7.5*  ?HCT 25.1* 22.5*  ?MCV 87.8 88.2  ?PLT 62* 38*  ? ?Cardiac Enzymes: ?No results for input(s): CKTOTAL, CKMB, CKMBINDEX, TROPONINI in the last 168 hours. ?BNP: ?Invalid input(s): POCBNP ?CBG: ?No results for input(s): GLUCAP in the last 168 hours. ?HbA1C: ?No results for input(s): HGBA1C in the last 72 hours. ?Urine analysis: ?   ?Component Value Date/Time  ? [place] YELLOW 06/26/2021 1850  ? APPEARANCEUR CLEAR 06/26/2021 1850  ? LABSPEC 1.032 (H) 06/26/2021 1850  ? PHURINE 5.0 06/26/2021 1850  ? [place] NEGATIVE 06/26/2021 1850  ? [place] NEGATIVE 06/26/2021 1850  ? [place] NEGATIVE 06/26/2021 1850   ? KETONESUR 20 (A) 06/26/2021 1850  ? PROTEINUR 30 (A) 06/26/2021 1850  ? NITRITE NEGATIVE 06/26/2021 1850  ? LEUKOCYTESUR NEGATIVE 06/26/2021 1850  ? ?Sepsis Labs: ?'@LABRCNTIP'$ (procalcitonin:4,lacticidven:4) ?) ?Recent Results (from the past 240 hour(s))  ?Culture, blood (Routine x 2)     Status: None (Preliminary result)  ? Collection Time: 06/26/21 12:11 PM  ? Specimen: BLOOD  ?Result Value Ref Range Status  ? Specimen Description BLOOD RIGHT ANTECUBITAL  Final  ? Special Requests   Final  ?  BOTTLES DRAWN AEROBIC AND ANAEROBIC Blood Culture adequate volume  ? Culture   Final  ?  NO GROWTH < 24 HOURS ?Performed at [facility], [address] [place], [place] [address] ?  ? Report Status PENDING  Incomplete  ?Culture, blood (Routine x 2)     Status: None (Preliminary result)  ? Collection Time: 06/26/21 12:11 PM  ? Specimen: BLOOD  ?Result Value Ref Range Status  ? Specimen Description BLOOD LEFT ANTECUBITAL  Final  ? Special Requests   Final  ?  BOTTLES DRAWN AEROBIC AND ANAEROBIC Blood Culture  adequate volume  ? Culture   Final  ?  NO GROWTH < 24 HOURS ?Performed at [facility], [address] [place], [place] [address] ?  ? Report Status PENDING  Incomplete  ?Resp Panel by RT-PCR (Flu A&B, Covid) Nasopharyngeal Swab     Status: None  ? Collection Time: 06/26/21 12:31 PM  ? Specimen: Nasopharyngeal Swab; Nasopharyngeal(NP) swabs in vial transport medium  ?Result Value Ref Range Status  ? SARS Coronavirus 2 by RT PCR NEGATIVE NEGATIVE Final  ?  Comment: (NOTE) ?SARS-CoV-2 target nucleic acids are NOT DETECTED. ? ?The SARS-CoV-2 RNA is generally detectable in upper respiratory ?specimens during the acute phase of infection. The lowest ?concentration of SARS-CoV-2 viral copies this assay can detect is ?138 copies/mL. A negative result does not preclude SARS-Cov-2 ?infection and should not be used as the sole basis for treatment or ?other patient management decisions. A negative result may occur with  ?improper  specimen collection/handling, submission of specimen other ?than nasopharyngeal swab, presence of viral mutation(s) within the ?areas targeted by this assay, and inadequate number of viral ?copies(<138 copies/mL).

## 2021-06-27 NOTE — Assessment & Plan Note (Addendum)
Foley placed in ED ?1000cc out with foley insertion ?d/c foley for voiding trial 4/23>>able to void without difficulty ?

## 2021-06-27 NOTE — Assessment & Plan Note (Addendum)
Neutropenic Enteritis ?4/20 CT abd--abnormal wall thickening in the distal and terminal ileum with adjacent inflammatory stranding ?Continue meropenem ?Stool pathogen panel--neg ?Check C. Difficile--not collected ?No further diarrhea; abd pain improved ?Advance diet to full liquids on 4/25 ?

## 2021-06-27 NOTE — Progress Notes (Signed)
?   06/27/21 0730  ?Assess: MEWS Score  ?Temp 100.3 ?F (37.9 ?C)  ?BP (!) 144/54  ?Pulse Rate (!) 123  ?Level of Consciousness Alert  ?SpO2 96 %  ?O2 Device Room Air  ?Assess: MEWS Score  ?MEWS Temp 0  ?MEWS Systolic 0  ?MEWS Pulse 2  ?MEWS RR 0  ?MEWS LOC 0  ?MEWS Score 2  ?MEWS Score Color Yellow  ?Treat  ?Pain Scale 0-10  ?Pain Score 4  ?Pain Type Acute pain  ?Pain Location Abdomen  ?Pain Orientation Other (Comment) ?(entire abdomen)  ?Pain Descriptors / Indicators Nagging  ?Pain Frequency Intermittent  ?Pain Onset Gradual  ?Patients Stated Pain Goal 0  ?Pain Intervention(s) Refused  ?Multiple Pain Sites No  ?Breathing 0  ?Negative Vocalization 0  ?Facial Expression 0  ?Body Language 0  ?Consolability 0  ?PAINAD Score 0  ?Document  ?Patient Outcome Other (Comment) ?(stable)  ? ? ?

## 2021-06-27 NOTE — Progress Notes (Signed)
?   06/27/21 0543  ?Vitals  ?Temp (!) 103.2 ?F (39.6 ?C)  ?Temp Source Oral  ?BP (!) 168/64  ?MAP (mmHg) 92  ?BP Location Left Arm  ?BP Method Automatic  ?Patient Position (if appropriate) Lying  ?Pulse Rate (!) 133  ?Pulse Rate Source [place]  ?Resp 18  ?Level of Consciousness  ?Level of Consciousness Alert  ?MEWS COLOR  ?MEWS Score Color Red  ?Oxygen Therapy  ?SpO2 98 %  ?O2 Device Room Air  ?MEWS Score  ?MEWS Temp 2  ?MEWS Systolic 0  ?MEWS Pulse 3  ?MEWS RR 0  ?MEWS LOC 0  ?MEWS Score 5  ? ? ?

## 2021-06-28 DIAGNOSIS — D709 Neutropenia, unspecified: Secondary | ICD-10-CM | POA: Diagnosis not present

## 2021-06-28 DIAGNOSIS — R5081 Fever presenting with conditions classified elsewhere: Secondary | ICD-10-CM | POA: Diagnosis not present

## 2021-06-28 DIAGNOSIS — K529 Noninfective gastroenteritis and colitis, unspecified: Secondary | ICD-10-CM | POA: Diagnosis not present

## 2021-06-28 LAB — COMPREHENSIVE METABOLIC PANEL
ALT: 10 U/L (ref 0–44)
AST: 12 U/L — ABNORMAL LOW (ref 15–41)
Albumin: 1.8 g/dL — ABNORMAL LOW (ref 3.5–5.0)
Alkaline Phosphatase: 43 U/L (ref 38–126)
Anion gap: 7 (ref 5–15)
BUN: 12 mg/dL (ref 8–23)
CO2: 21 mmol/L — ABNORMAL LOW (ref 22–32)
Calcium: 6.5 mg/dL — ABNORMAL LOW (ref 8.9–10.3)
Chloride: 107 mmol/L (ref 98–111)
Creatinine, Ser: 0.75 mg/dL (ref 0.44–1.00)
GFR, Estimated: >60 mL/min (ref >60)
Glucose, Bld: 104 mg/dL — ABNORMAL HIGH (ref 70–99)
Potassium: 3.6 mmol/L (ref 3.5–5.1)
Sodium: 135 mmol/L (ref 135–145)
Total Bilirubin: 0.5 mg/dL (ref 0.3–1.2)
Total Protein: 4.3 g/dL — ABNORMAL LOW (ref 6.5–8.1)

## 2021-06-28 LAB — CBC WITH DIFFERENTIAL/PLATELET
Abs Immature Granulocytes: 0.06 10*3/uL (ref 0.00–0.07)
Basophils Absolute: 0 10*3/uL (ref 0.0–0.1)
Basophils Relative: 2 %
Eosinophils Absolute: 0 10*3/uL (ref 0.0–0.5)
Eosinophils Relative: 4 %
HCT: 24 % — ABNORMAL LOW (ref 36.0–46.0)
Hemoglobin: 7.8 g/dL — ABNORMAL LOW (ref 12.0–15.0)
Immature Granulocytes: 12 %
Lymphocytes Relative: 8 %
Lymphs Abs: 0 10*3/uL — ABNORMAL LOW (ref 0.7–4.0)
MCH: 28.5 pg (ref 26.0–34.0)
MCHC: 32.5 g/dL (ref 30.0–36.0)
MCV: 87.6 fL (ref 80.0–100.0)
Monocytes Absolute: 0.2 10*3/uL (ref 0.1–1.0)
Monocytes Relative: 29 %
Neutro Abs: 0.2 10*3/uL — CL (ref 1.7–7.7)
Neutrophils Relative %: 45 %
Platelets: 29 10*3/uL — CL (ref 150–400)
RBC: 2.74 MIL/uL — ABNORMAL LOW (ref 3.87–5.11)
RDW: 17.2 % — ABNORMAL HIGH (ref 11.5–15.5)
WBC: 0.5 10*3/uL — CL (ref 4.0–10.5)
nRBC: 0 % (ref 0.0–0.2)

## 2021-06-28 LAB — URINE CULTURE: Culture: NO GROWTH

## 2021-06-28 LAB — MAGNESIUM: Magnesium: 1.3 mg/dL — ABNORMAL LOW (ref 1.7–2.4)

## 2021-06-28 MED ORDER — METOCLOPRAMIDE HCL 5 MG/ML IJ SOLN
5.0000 mg | Freq: Four times a day (QID) | INTRAMUSCULAR | Status: DC | PRN
  Administered 2021-06-29: 5 mg via INTRAVENOUS
  Filled 2021-06-28: qty 2

## 2021-06-28 MED ORDER — METOPROLOL TARTRATE 5 MG/5ML IV SOLN
5.0000 mg | Freq: Four times a day (QID) | INTRAVENOUS | Status: DC
  Administered 2021-06-28 – 2021-07-02 (×15): 5 mg via INTRAVENOUS
  Filled 2021-06-28 (×15): qty 5

## 2021-06-28 MED ORDER — MAGNESIUM SULFATE 2 GM/50ML IV SOLN
2.0000 g | Freq: Once | INTRAVENOUS | Status: AC
  Administered 2021-06-28: 2 g via INTRAVENOUS
  Filled 2021-06-28: qty 50

## 2021-06-28 MED ORDER — CHLORHEXIDINE GLUCONATE CLOTH 2 % EX PADS
6.0000 | MEDICATED_PAD | Freq: Every day | CUTANEOUS | Status: DC
  Administered 2021-06-28 – 2021-07-01 (×4): 6 via TOPICAL

## 2021-06-28 NOTE — Progress Notes (Signed)
?   06/28/21 0933  ?Provider Notification  ?Provider Name/Title Dr. Tat/Attending  ?Date Provider Notified 06/28/21  ?Time Provider Notified [phone]  ?Method of Notification Page  ?Notification Reason Critical result  ?Test performed and critical result WBC: 0.5, Platelet: 29, ANC: 0.2  ?Date Critical Result Received 06/28/21  ?Time Critical Result Received [phone]  ?Provider response No new orders  ?Date of Provider Response 06/28/21  ?Time of Provider Response 0940  ? ? ?

## 2021-06-28 NOTE — Progress Notes (Signed)
?  ?       ?PROGRESS NOTE ? ?SEARRA CARNATHAN RXV:[id number] DOB: 11-13-1945 DOA: 06/26/2021 ?PCP: Jettie Booze, NP ? ?Brief History:  ?76 year old female with a history of hypertension and anal squamous cell carcinoma presenting with fevers, chills, and abdominal pain that started on 06/24/2021.  The patient had her chemotherapy infusion pump removed on 06/20/2021.  She has been receiving mitomycin and 5-FU since her diagnosis of anal squamous cell carcinoma noted on colonoscopy on 04/17/2021.  She has also been receiving radiation on a daily basis.  She was due to finish radiation on the first week of May 2023.  Nevertheless, the patient received IV fluids and potassium infusion on 06/24/2021 at the cancer center at [facility].   That evening, she began having high fevers up to 104.0 ?F with abdominal pain.  She denies any headache, chest pain, shortness of breath, hemoptysis, vomiting.  She has had loose stools that have slightly worsened since chemotherapy.  There is no hematochezia or melena.  She denies any dysuria or hematuria. ?In the ED, the patient had a fever up to 103.3 ?F.  She was hemodynamically stable but tachycardic into the 130s.  Oxygen saturation was 98% on room air.  WBC 0.3, hemoglobin 8.4, platelets 62,000.  Sodium 137, potassium 3.3, bicarbonate 24, serum creatinine 0.99.  UA was negative for pyuria.  CT of the abdomen and pelvis showed abnormal wall thickening in the distal and terminal ileum.  There is also adjacent inflammatory stranding without loculated fluid collection.  There is no obstruction or hydronephrosis.  The patient was initially started on vancomycin, cefepime, and metronidazole.  She was admitted for further evaluation and treatment of her neutropenic fever.  ? ? ?Assessment and Plan: ?* Febrile neutropenia ([place]) ?ANC = 108 at time of admission ?06/28/21 [place] 285 ?Etiology is likely neutropenic enteritis ?Continue IV fluids ?Continue empiric vancomycin ?Start IV meropenem ?UA  negative for pyuria ?Chest x-ray negative for infiltrates ?Follow blood cultures and urine cultures--neg to date ?Continue G-CSF ? ?Sepsis due to undetermined organism [employer] [employer]) ?Neutropenic enteritis ?Presented with fever and tachycardia and neutropenia ?Continue IV fluids ?Continue vancomycin ?continue meropenem ?Follow blood cultures--neg to date ?Personally reviewed chest x-ray--no infiltrates or edema ? ?Enteritis ?Neutropenic Enteritis ?4/20 CT abd--abnormal wall thickening in the distal and terminal ileum with adjacent inflammatory stranding ?Start meropenem ?Stool pathogen panel--no BM ?Check C. Difficile--no BM ? ?Pancytopenia ([place]) ?Pancytopenia with leukopenia of 0.3, thrombocytopenia of 62, and anemia hemoglobin 8.4-baseline 11-13.   ?Likely secondary to chemotherapy-she is on mitomycin and 5-FU. ? 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 Georges Lynch ?She is on chemoradiation therapy.   ?chemoinfusion pump removed 06/20/21 ? ? ?Urine retention ?Foley placed in ED ?1000cc out with foley insertion ?Plan to d/c foley for voiding trial 4/23 ? ?Hypertension ?Stable. ?-Hold 10 mg lisinopril for now with contrast exposure and to allow BP margin for metoprolol titration ? ?Family Communication:   spouse updated at bedside 4/22 ?  ?Consultants:  none ?  ?Code Status:  FULL / ?  ?DVT Prophylaxis:  [place] Heparin / [place] Lovenox ?  ?  ?Procedures: ?As Listed in Progress Note Above ?  ?Antibiotics: ?Merrem 4/21>> ?Vanc 4/21>> ?  ?  ? ? ? ? ?Subjective: ?Patient denies fevers, chills, headache, chest pain, dyspnea,  diarrhea, abdominal pain, dysuria, hematuria, hematochezia, and melena. ?She had one episode n/v.  Abd pain is a  little better ? ? ?Objective: ?Vitals:  ? 06/27/21 2209 06/28/21 0219 06/28/21 0540 06/28/21 1122  ?BP: (!) 142/60 140/66 140/67 (!) 145/72  ?Pulse: (!) 122 (!) 102 (!) 112 (!) 122  ?Resp: '19 18 18 18   '$ ?Temp: 99.8 ?F (37.7 ?C) 98.2 ?F (36.8 ?C) 97.8 ?F (36.6 ?C) 98.5 ?F (36.9 ?C)  ?TempSrc:    Oral  ?SpO2: 97% 97% 99% 98%  ?Weight:      ?Height:      ? ? ?Intake/Output Summary (Last 24 hours) at 06/28/2021 1341 ?Last data filed at 06/28/2021 0900 ?Gross per 24 hour  ?Intake 1109.3 ml  ?Output 800 ml  ?Net 309.3 ml  ? ?Weight change:  ?Exam: ? ?General:  Pt is alert, follows commands appropriately, not in acute distress ?HEENT: No icterus, No thrush, No neck mass, [place]/AT ?Cardiovascular: RRR, S1/S2, no rubs, no gallops ?Respiratory: CTA bilaterally, no wheezing, no crackles, no rhonchi ?Abdomen: Soft/+BS, epigastric and periumbilical tender, non distended, no guarding ?Extremities: No edema, No lymphangitis, No petechiae, No rashes, no synovitis ? ? ?Data Reviewed: ?I have personally reviewed following labs and imaging studies ?Basic Metabolic Panel: ?Recent Labs  ?Lab 06/26/21 ?1219 06/27/21 ?1761 06/28/21 ?0758  ?NA 137 137 135  ?K 3.3* 4.5 3.6  ?CL 105 109 107  ?CO2 24 22 21*  ?GLUCOSE 131* 102* 104*  ?BUN '14 12 12  '$ ?CREATININE 0.99 0.85 0.75  ?CALCIUM 7.1* 6.9* 6.5*  ?MG  --   --  1.3*  ? ?Liver Function Tests: ?Recent Labs  ?Lab 06/26/21 ?1219 06/28/21 ?0758  ?AST 16 12*  ?ALT 13 10  ?ALKPHOS 56 43  ?BILITOT 0.6 0.5  ?PROT 5.3* 4.3*  ?ALBUMIN 2.4* 1.8*  ? ?No results for input(s): LIPASE, AMYLASE in the last 168 hours. ?No results for input(s): AMMONIA in the last 168 hours. ?Coagulation Profile: ?Recent Labs  ?Lab 06/26/21 ?1219  ?INR 1.3*  ? ?CBC: ?Recent Labs  ?Lab 06/26/21 ?1219 06/27/21 ?6073 06/28/21 ?0758  ?WBC 0.3* 0.3* 0.5*  ?NEUTROABS 0.1*  --  0.2*  ?HGB 8.4* 7.5* 7.8*  ?HCT 25.1* 22.5* 24.0*  ?MCV 87.8 88.2 87.6  ?PLT 62* 38* 29*  ? ?Cardiac Enzymes: ?No results for input(s): CKTOTAL, CKMB, CKMBINDEX, TROPONINI in the last 168 hours. ?BNP: ?Invalid input(s): POCBNP ?CBG: ?No results for input(s): GLUCAP in the last 168 hours. ?HbA1C: ?No results for input(s): HGBA1C in the last 72 hours. ?Urine  analysis: ?   ?Component Value Date/Time  ? [place] YELLOW 06/26/2021 1850  ? APPEARANCEUR CLEAR 06/26/2021 1850  ? LABSPEC 1.032 (H) 06/26/2021 1850  ? PHURINE 5.0 06/26/2021 1850  ? [place] NEGATIVE 06/26/2021 1850  ? [place] NEGATIVE 06/26/2021 1850  ? [place] NEGATIVE 06/26/2021 1850  ? KETONESUR 20 (A) 06/26/2021 1850  ? PROTEINUR 30 (A) 06/26/2021 1850  ? NITRITE NEGATIVE 06/26/2021 1850  ? LEUKOCYTESUR NEGATIVE 06/26/2021 1850  ? ?Sepsis Labs: ?'@LABRCNTIP'$ (procalcitonin:4,lacticidven:4) ?) ?Recent Results (from the past 240 hour(s))  ?Culture, blood (Routine x 2)     Status: None (Preliminary result)  ? Collection Time: 06/26/21 12:11 PM  ? Specimen: BLOOD  ?Result Value Ref Range Status  ? Specimen Description BLOOD RIGHT ANTECUBITAL  Final  ? Special Requests   Final  ?  BOTTLES DRAWN AEROBIC AND ANAEROBIC Blood Culture adequate volume  ? Culture   Final  ?  NO GROWTH 2 DAYS ?Performed at [facility], [address] [place], [place] [address] ?  ? Report Status PENDING  Incomplete  ?Culture, blood (  Routine x 2)     Status: None (Preliminary result)  ? Collection Time: 06/26/21 12:11 PM  ? Specimen: BLOOD  ?Result Value Ref Range Status  ? Specimen Description BLOOD LEFT ANTECUBITAL  Final  ? Special Requests   Final  ?  BOTTLES DRAWN AEROBIC AND ANAEROBIC Blood Culture adequate volume  ? Culture   Final  ?  NO GROWTH 2 DAYS ?Performed at [facility], [address], [place], [place] [address] ?  ? Report Status PENDING  Incomplete  ?Resp Panel by RT-PCR (Flu A&B, Covid) Nasopharyngeal Swab     Status: None  ? Collection Time: 06/26/21 12:31 PM  ? Specimen: Nasopharyngeal Swab; Nasopharyngeal(NP) swabs in vial transport medium  ?Result Value Ref Range Status  ? SARS Coronavirus 2 by RT PCR NEGATIVE NEGATIVE Final  ?  Comment: (NOTE) ?SARS-CoV-2 target nucleic acids are NOT DETECTED. ? ?The SARS-CoV-2 RNA is generally detectable in upper respiratory ?specimens during the acute phase of infection. The  lowest ?concentration of SARS-CoV-2 viral copies this assay can detect is ?138 copies/mL. A negative result does not preclude SARS-Cov-2 ?infection and should not be used as the sole basis for treatment or ?other patien

## 2021-06-28 NOTE — Progress Notes (Signed)
?   06/28/21 1122  ?Assess: MEWS Score  ?Temp 98.5 ?F (36.9 ?C)  ?BP (!) 145/72  ?Pulse Rate (!) 122  ?Resp 18  ?SpO2 98 %  ?O2 Device Room Air  ?O2 Flow Rate (L/min) 0 L/min  ?Assess: MEWS Score  ?MEWS Temp 0  ?MEWS Systolic 0  ?MEWS Pulse 2  ?MEWS RR 0  ?MEWS LOC 0  ?MEWS Score 2  ?MEWS Score Color Yellow  ?Assess: if the MEWS score is Yellow or Red  ?Were vital signs taken at a resting state? Yes  ?Focused Assessment No change from prior assessment  ?Early Detection of Sepsis Score *See Row Information* High  ?MEWS guidelines implemented *See Row Information* Yes  ?Take Vital Signs  ?Increase Vital Sign Frequency  Yellow: Q 2hr X 2 then Q 4hr X 2, if remains yellow, continue Q 4hrs  ?Escalate  ?MEWS: Escalate Yellow: discuss with charge nurse/RN and consider discussing with provider and RRT  ?Notify: Charge Nurse/RN  ?Name of Charge Nurse/RN Notified Lowry Ram RN  ?Date Charge Nurse/RN Notified 06/28/21  ?Time Charge Nurse/RN Notified 1130  ? ? ?

## 2021-06-28 NOTE — Progress Notes (Deleted)
?   06/28/21 0907  ?ECG Monitoring  ?PR interval 0.17  ?QRS interval 0.07  ?QT interval 0.33  ?QTc interval 0.47  ?Provider Notification  ?Provider Name/Title Dr. Carles Collet Attending  ?Date Provider Notified 06/28/21  ?Time Provider Notified [phone]  ?Method of Notification Page  ?Notification Reason Critical result  ?Test performed and critical result WBC: 0.5, Platelet: 29, ANC: 0.2  ?Date Critical Result Received 06/28/21  ?Time Critical Result Received 0905  ?Provider response No new orders  ?Date of Provider Response 06/28/21  ?Time of Provider Response [phone]  ? ? ?

## 2021-06-28 NOTE — Progress Notes (Signed)
Pharmacy Antibiotic Note ? ?[name] is a 76 y.o. female admitted on 06/26/2021 with  unknown source .  Pharmacy has been consulted for cefepime and vancomycin dosing. ?Patient with rectal CA at [facility], receiving Mitomycin + 5 FU 96 hr home infusion. Patient with noted chemotherapy induced neutropenia at 06/02/21 visit. ANC 100 today. Empiric tx with broad spectrum abx ? ?Plan: ?Vancomycin '1250mg'$  IV loading dose then 1000 mg IV Q 24 hrs. Goal AUC 400-550. ?Expected AUC: 511 ?SCr used: 0.99  ?Cefepime 2gm IV q12h ?F/u cxs and clinical progress ?Monitor V/S, labs and levels as indicated ? ?Height: '5\' 6"'$  (167.6 cm) ?Weight: 65.2 kg (143 lb 11.8 oz) ?IBW/kg (Calculated) : 59.3 ? ?Temp (24hrs), Avg:100.5 ?F (38.1 ?C), Min:97.8 ?F (36.6 ?C), Max:103.1 ?F (39.5 ?C) ? ?Recent Labs  ?Lab 06/26/21 ?1219 06/27/21 ?0542  ?WBC 0.3* 0.3*  ?CREATININE 0.99 0.85  ?LATICACIDVEN 1.4  --   ? ?  ?Estimated Creatinine Clearance: 54.4 mL/min (by C-G formula based on SCr of 0.85 mg/dL).   ? ?Allergies  ?Allergen Reactions  ? Levaquin [Levofloxacin In D5w] Nausea And Vomiting  ? Morphine And Related Nausea And Vomiting  ? Prednisolone Acetate   ?  Liquid medication.  Made heart race and couldn't catch breath  ? ? ?Antimicrobials this admission: ?vancomycin 4/20 >>  ?cefepime 4/20 >>   ?Metronidazole 4/20 IV x 1 in ED ? ?Microbiology results: ?4/20 BCx: pending ? MRSA PCR:  ? ?Thank you for allowing pharmacy to be a part of this patient?s care. ? ?Hart Robinsons, PharmD ?Clinical Pharmacist ? ?06/28/2021 7:40 AM ? ?

## 2021-06-29 ENCOUNTER — Inpatient Hospital Stay (HOSPITAL_COMMUNITY): Payer: Medicare Other

## 2021-06-29 DIAGNOSIS — D709 Neutropenia, unspecified: Secondary | ICD-10-CM | POA: Diagnosis not present

## 2021-06-29 DIAGNOSIS — K529 Noninfective gastroenteritis and colitis, unspecified: Secondary | ICD-10-CM | POA: Diagnosis not present

## 2021-06-29 DIAGNOSIS — R1110 Vomiting, unspecified: Secondary | ICD-10-CM | POA: Diagnosis not present

## 2021-06-29 DIAGNOSIS — C210 Malignant neoplasm of anus, unspecified: Secondary | ICD-10-CM | POA: Diagnosis not present

## 2021-06-29 LAB — BASIC METABOLIC PANEL
Anion gap: 6 (ref 5–15)
BUN: 13 mg/dL (ref 8–23)
CO2: 23 mmol/L (ref 22–32)
Calcium: 6.5 mg/dL — ABNORMAL LOW (ref 8.9–10.3)
Chloride: 109 mmol/L (ref 98–111)
Creatinine, Ser: 0.71 mg/dL (ref 0.44–1.00)
GFR, Estimated: >60 mL/min (ref >60)
Glucose, Bld: 100 mg/dL — ABNORMAL HIGH (ref 70–99)
Potassium: 3.7 mmol/L (ref 3.5–5.1)
Sodium: 138 mmol/L (ref 135–145)

## 2021-06-29 LAB — HEMOGLOBIN AND HEMATOCRIT, BLOOD
HCT: 25.6 % — ABNORMAL LOW (ref 36.0–46.0)
Hemoglobin: 8.4 g/dL — ABNORMAL LOW (ref 12.0–15.0)

## 2021-06-29 LAB — CBC WITH DIFFERENTIAL/PLATELET
Band Neutrophils: 23 %
Basophils Absolute: 0 10*3/uL (ref 0.0–0.1)
Basophils Relative: 0 %
Blasts: 1 %
Eosinophils Absolute: 0 10*3/uL (ref 0.0–0.5)
Eosinophils Relative: 1 %
HCT: 21.7 % — ABNORMAL LOW (ref 36.0–46.0)
Hemoglobin: 7 g/dL — ABNORMAL LOW (ref 12.0–15.0)
Lymphocytes Relative: 21 %
Lymphs Abs: 0.3 10*3/uL — ABNORMAL LOW (ref 0.7–4.0)
MCH: 28.8 pg (ref 26.0–34.0)
MCHC: 32.3 g/dL (ref 30.0–36.0)
MCV: 89.3 fL (ref 80.0–100.0)
Metamyelocytes Relative: 1 %
Monocytes Absolute: 0.1 10*3/uL (ref 0.1–1.0)
Monocytes Relative: 5 %
Neutro Abs: 1.1 10*3/uL — ABNORMAL LOW (ref 1.7–7.7)
Neutrophils Relative %: 48 %
Platelets: 25 10*3/uL — CL (ref 150–400)
RBC: 2.43 MIL/uL — ABNORMAL LOW (ref 3.87–5.11)
RDW: 17.1 % — ABNORMAL HIGH (ref 11.5–15.5)
WBC: 1.5 10*3/uL — ABNORMAL LOW (ref 4.0–10.5)
nRBC: 2 % — ABNORMAL HIGH (ref 0.0–0.2)

## 2021-06-29 LAB — MAGNESIUM: Magnesium: 2 mg/dL (ref 1.7–2.4)

## 2021-06-29 LAB — PREPARE RBC (CROSSMATCH)

## 2021-06-29 LAB — ABO/RH: ABO/RH(D): B POS

## 2021-06-29 MED ORDER — METOCLOPRAMIDE HCL 5 MG/ML IJ SOLN
5.0000 mg | Freq: Four times a day (QID) | INTRAMUSCULAR | Status: DC
  Administered 2021-06-29 – 2021-07-02 (×13): 5 mg via INTRAVENOUS
  Filled 2021-06-29 (×13): qty 2

## 2021-06-29 MED ORDER — PANTOPRAZOLE SODIUM 40 MG IV SOLR
40.0000 mg | Freq: Two times a day (BID) | INTRAVENOUS | Status: DC
  Administered 2021-06-29 – 2021-07-02 (×7): 40 mg via INTRAVENOUS
  Filled 2021-06-29 (×7): qty 10

## 2021-06-29 MED ORDER — SODIUM CHLORIDE 0.9% IV SOLUTION: Freq: Once | INTRAVENOUS | Status: AC

## 2021-06-29 NOTE — Progress Notes (Signed)
?  ?       ?PROGRESS NOTE ? ?Jenny Martinez AJG:[id number] DOB: [birth year] DOA: 06/26/2021 ?PCP: Jettie Booze, NP ? ?Brief History:  ?76 year old female with a history of hypertension and anal squamous cell carcinoma presenting with fevers, chills, and abdominal pain that started on 06/24/2021.  The patient had her chemotherapy infusion pump removed on 06/20/2021.  She has been receiving mitomycin and 5-FU since her diagnosis of anal squamous cell carcinoma noted on colonoscopy on 04/17/2021.  She has also been receiving radiation on a daily basis.  She was due to finish radiation on the first week of May 2023.  Nevertheless, the patient received IV fluids and potassium infusion on 06/24/2021 at the cancer center at [facility].   That evening, she began having high fevers up to 104.0 ?F with abdominal pain.  She denies any headache, chest pain, shortness of breath, hemoptysis, vomiting.  She has had loose stools that have slightly worsened since chemotherapy.  There is no hematochezia or melena.  She denies any dysuria or hematuria. ?In the ED, the patient had a fever up to 103.3 ?F.  She was hemodynamically stable but tachycardic into the 130s.  Oxygen saturation was 98% on room air.  WBC 0.3, hemoglobin 8.4, platelets 62,000.  Sodium 137, potassium 3.3, bicarbonate 24, serum creatinine 0.99.  UA was negative for pyuria.  CT of the abdomen and pelvis showed abnormal wall thickening in the distal and terminal ileum.  There is also adjacent inflammatory stranding without loculated fluid collection.  There is no obstruction or hydronephrosis.  The patient was initially started on vancomycin, cefepime, and metronidazole.  She was admitted for further evaluation and treatment of her neutropenic fever.  ? ? ? ?Assessment and Plan: ?* Febrile neutropenia ([place]) ?ANC = 108 at time of admission ?06/29/21 [place] 1065 ?Etiology is likely neutropenic enteritis ?Continue IV fluids ?Continue empiric vancomycin ?Start IV meropenem ?UA  negative for pyuria ?Chest x-ray negative for infiltrates ?Follow blood cultures and urine cultures--neg to date ?Continue G-CSF ?Fevers overall improved and ANC improving ? ?Sepsis due to undetermined organism [facility]) ?Neutropenic enteritis ?Presented with fever and tachycardia and neutropenia ?Continue IV fluids ?Continue vancomycin ?continue meropenem ?Follow blood cultures--neg to date ?Urine culture neg ?Personally reviewed chest x-ray--no infiltrates or edema ? ?Enteritis ?Neutropenic Enteritis ?4/20 CT abd--abnormal wall thickening in the distal and terminal ileum with adjacent inflammatory stranding ?Start meropenem ?Stool pathogen panel--pending ?Check C. Difficile--pending ? ?Pancytopenia ([place]) ?Pancytopenia with leukopenia of 0.3, thrombocytopenia of 62, and anemia hemoglobin 8.4-baseline 11-13.   ?Likely secondary to chemotherapy-she is on mitomycin and 5-FU. ? 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 Georges Lynch ?She is on chemoradiation therapy.   ?chemoinfusion pump removed 06/20/21 after last dose  ? ? ?Intractable vomiting ?Pt with intolerances to compazine and zofran ?-discussed risks of reglan>>pt willing to try it ?-start reglan q 6 ?Start pantoprazole bid ?2 view AXR ? ?Urine retention ?Foley placed in ED ?1000cc out with foley insertion ?Plan to d/c foley for voiding trial 4/23 ? ?Hypertension ?Stable. ?-Hold 10 mg lisinopril for now with contrast exposure and to allow BP margin for metoprolol titration ? ? ? ? ? ?Family Communication:   daughter updated at bedside ? ?Consultants:  none ? ?Code Status:  FULL ? ?DVT Prophylaxis:  SCDs ? ? ?Procedures: ?As Listed in Progress Note Above ? ?Antibiotics: ?Merrem 4/21>> ?Vanc 4/21>>4/23 ? ? ? ? ? ?  Subjective: ?Pt complains of emesis without blood.  States abd pain is no worse.  Had one small BM.  Denies cp, sob, cough, headache ? ?Objective: ?Vitals:  ?  06/28/21 1827 06/28/21 2100 06/29/21 0000 06/29/21 0557  ?BP: (!) 164/63 (!) 156/60 (!) 139/51 (!) 150/75  ?Pulse: (!) 119 (!) 114 (!) 114 (!) 110  ?Resp: (!) '22 20 20 18  '$ ?Temp: 99.1 ?F (37.3 ?C) 99.3 ?F (37.4 ?C) 98.8 ?F (37.1 ?C) 97.7 ?F (36.5 ?C)  ?TempSrc: Oral Oral Oral   ?SpO2: 94% 93% 97% 94%  ?Weight:      ?Height:      ? ? ?Intake/Output Summary (Last 24 hours) at 06/29/2021 1119 ?Last data filed at 06/29/2021 0601 ?Gross per 24 hour  ?Intake 4727.01 ml  ?Output 1 ml  ?Net 4726.01 ml  ? ?Weight change:  ?Exam: ? ?General:  Pt is alert, follows commands appropriately, not in acute distress ?HEENT: No icterus, No thrush, No neck mass, [place]/AT ?Cardiovascular: RRR, S1/S2, no rubs, no gallops ?Respiratory: CTA bilaterally, no wheezing, no crackles, no rhonchi ?Abdomen: Soft/+BS, mild diffuse tender, non distended, no guarding ?Extremities: trace LE edema, No lymphangitis, No petechiae, No rashes, no synovitis ? ? ?Data Reviewed: ?I have personally reviewed following labs and imaging studies ?Basic Metabolic Panel: ?Recent Labs  ?Lab 06/26/21 ?1219 06/27/21 ?2536 06/28/21 ?6440 06/29/21 ?0446  ?NA 137 137 135 138  ?K 3.3* 4.5 3.6 3.7  ?CL 105 109 107 109  ?CO2 24 22 21* 23  ?GLUCOSE 131* 102* 104* 100*  ?BUN '14 12 12 13  '$ ?CREATININE 0.99 0.85 0.75 0.71  ?CALCIUM 7.1* 6.9* 6.5* 6.5*  ?MG  --   --  1.3* 2.0  ? ?Liver Function Tests: ?Recent Labs  ?Lab 06/26/21 ?1219 06/28/21 ?0758  ?AST 16 12*  ?ALT 13 10  ?ALKPHOS 56 43  ?BILITOT 0.6 0.5  ?PROT 5.3* 4.3*  ?ALBUMIN 2.4* 1.8*  ? ?No results for input(s): LIPASE, AMYLASE in the last 168 hours. ?No results for input(s): AMMONIA in the last 168 hours. ?Coagulation Profile: ?Recent Labs  ?Lab 06/26/21 ?1219  ?INR 1.3*  ? ?CBC: ?Recent Labs  ?Lab 06/26/21 ?1219 06/27/21 ?3474 06/28/21 ?2595 06/29/21 ?0446  ?WBC 0.3* 0.3* 0.5* 1.5*  ?NEUTROABS 0.1*  --  0.2* 1.1*  ?HGB 8.4* 7.5* 7.8* 7.0*  ?HCT 25.1* 22.5* 24.0* 21.7*  ?MCV 87.8 88.2 87.6 89.3  ?PLT 62* 38* 29* 25*   ? ?Cardiac Enzymes: ?No results for input(s): CKTOTAL, CKMB, CKMBINDEX, TROPONINI in the last 168 hours. ?BNP: ?Invalid input(s): POCBNP ?CBG: ?No results for input(s): GLUCAP in the last 168 hours. ?HbA1C: ?No results for input(s): HGBA1C in the last 72 hours. ?Urine analysis: ?   ?Component Value Date/Time  ? [place] YELLOW 06/26/2021 1850  ? APPEARANCEUR CLEAR 06/26/2021 1850  ? LABSPEC 1.032 (H) 06/26/2021 1850  ? PHURINE 5.0 06/26/2021 1850  ? [place] NEGATIVE 06/26/2021 1850  ? [place] NEGATIVE 06/26/2021 1850  ? [place] NEGATIVE 06/26/2021 1850  ? KETONESUR 20 (A) 06/26/2021 1850  ? PROTEINUR 30 (A) 06/26/2021 1850  ? NITRITE NEGATIVE 06/26/2021 1850  ? LEUKOCYTESUR NEGATIVE 06/26/2021 1850  ? ?Sepsis Labs: ?'@LABRCNTIP'$ (procalcitonin:4,lacticidven:4) ?) ?Recent Results (from the past 240 hour(s))  ?Culture, blood (Routine x 2)     Status: None (Preliminary result)  ? Collection Time: 06/26/21 12:11 PM  ? Specimen: BLOOD  ?Result Value Ref Range Status  ? Specimen Description BLOOD RIGHT ANTECUBITAL  Final  ? Special Requests   Final  ?  BOTTLES DRAWN AEROBIC AND ANAEROBIC Blood Culture adequate volume  ? Culture   Final  ?  NO GROWTH 3 DAYS ?Performed at [facility], [address], [place], [place] [address] ?  ? Report Status PENDING  Incomplete  ?Culture, blood (Routine x 2)     Status: None (Preliminary result)  ? Collection Time: 06/26/21 12:11 PM  ? Specimen: BLOOD  ?Result Value Ref Range Status  ? Specimen Description BLOOD LEFT ANTECUBITAL  Final  ? Special Requests   Final  ?  BOTTLES DRAWN AEROBIC AND ANAEROBIC Blood Culture adequate volume  ? Culture   Final  ?  NO GROWTH 3 DAYS ?Performed at [facility], [address] [place], [place] [address] ?  ? Report Status PENDING  Incomplete  ?Resp Panel by RT-PCR (Flu A&B, Covid) Nasopharyngeal Swab     Status: None  ? Collection Time: 06/26/21 12:31 PM  ? Specimen: Nasopharyngeal Swab; Nasopharyngeal(NP) swabs in vial transport medium  ?Result  Value Ref Range Status  ? SARS Coronavirus 2 by RT PCR NEGATIVE NEGATIVE Final  ?  Comment: (NOTE) ?SARS-CoV-2 target nucleic acids are NOT DETECTED. ? ?The SARS-CoV-2 RNA is generally detectable in upper respirat

## 2021-06-29 NOTE — Assessment & Plan Note (Addendum)
Pt with intolerances to compazine and zofran ?-discussed risks of reglan>>pt willing to try it ?-continue reglan q 6 ?Continue pantoprazole bid ?2 view AXR-mild gaseous SB distensino ?--had 2 BMs last 24 hours ?4/24--no emesis in last 36 hours ?4/25--advance diet to full liquids ?

## 2021-06-29 NOTE — Progress Notes (Signed)
Pharmacy Antibiotic Note ? ?Jenny Martinez is a 76 y.o. female admitted on 06/26/2021 with  unknown source .   ?Patient with rectal CA at [facility], receiving Mitomycin + 5 FU 96 hr home infusion. Patient with noted chemotherapy induced neutropenia at 06/02/21 visit. ANC 100 today. Empiric tx with broad spectrum abx ? ?Plan: ?Vancomycin '1250mg'$  IV loading dose then 1000 mg IV Q 24 hrs. Goal AUC 400-550. ?Expected AUC: 511 ?SCr used: 0.99  ? ?* Febrile neutropenia ([facility]) ?ANC = 108 at time of admission ?06/28/21 [place] 285 ?Etiology is likely neutropenic enteritis ?Continue empiric vancomycin ?Start IV meropenem ?UA negative for pyuria ?Chest x-ray negative for infiltrates ?Follow blood cultures and urine cultures--neg to date ?Continue G-CSF ? ?Height: '5\' 6"'$  (167.6 cm) ?Weight: 65.2 kg (143 lb 11.8 oz) ?IBW/kg (Calculated) : 59.3 ? ?Temp (24hrs), Avg:98.9 ?F (37.2 ?C), Min:97.7 ?F (36.5 ?C), Max:100.5 ?F (38.1 ?C) ? ?Recent Labs  ?Lab 06/26/21 ?1219 06/27/21 ?7782 06/28/21 ?4235 06/29/21 ?0446  ?WBC 0.3* 0.3* 0.5* 1.5*  ?CREATININE 0.99 0.85 0.75 0.71  ?LATICACIDVEN 1.4  --   --   --   ? ?  ?Estimated Creatinine Clearance: 57.8 mL/min (by C-G formula based on SCr of 0.71 mg/dL).   ? ?Allergies  ?Allergen Reactions  ? Levaquin [Levofloxacin In D5w] Nausea And Vomiting  ? Morphine And Related Nausea And Vomiting  ? Prednisolone Acetate   ?  Liquid medication.  Made heart race and couldn't catch breath  ? ? ?Antimicrobials this admission: ?vancomycin 4/20 >>  ?Meropenem 4/21 >> ?cefepime 4/20 >>  4/21 ?Metronidazole 4/20 IV x 1 in ED ? ?Microbiology results: ?4/20 BCx: pending ? MRSA PCR:  ? ?Thank you for allowing pharmacy to be a part of this patient?s care. ? ?Hart Robinsons, PharmD ?Clinical Pharmacist ? ?06/29/2021 8:43 AM ? ?

## 2021-06-30 DIAGNOSIS — R5081 Fever presenting with conditions classified elsewhere: Secondary | ICD-10-CM | POA: Diagnosis not present

## 2021-06-30 DIAGNOSIS — K529 Noninfective gastroenteritis and colitis, unspecified: Secondary | ICD-10-CM | POA: Diagnosis not present

## 2021-06-30 DIAGNOSIS — C210 Malignant neoplasm of anus, unspecified: Secondary | ICD-10-CM | POA: Diagnosis not present

## 2021-06-30 DIAGNOSIS — D709 Neutropenia, unspecified: Secondary | ICD-10-CM | POA: Diagnosis not present

## 2021-06-30 LAB — GASTROINTESTINAL PANEL BY PCR, STOOL (REPLACES STOOL CULTURE)

## 2021-06-30 LAB — BASIC METABOLIC PANEL
Anion gap: 6 (ref 5–15)
BUN: 9 mg/dL (ref 8–23)
CO2: 21 mmol/L — ABNORMAL LOW (ref 22–32)
Calcium: 6.5 mg/dL — ABNORMAL LOW (ref 8.9–10.3)
Chloride: 112 mmol/L — ABNORMAL HIGH (ref 98–111)
Creatinine, Ser: 0.65 mg/dL (ref 0.44–1.00)
GFR, Estimated: >60 mL/min (ref >60)
Glucose, Bld: 95 mg/dL (ref 70–99)
Potassium: 3.2 mmol/L — ABNORMAL LOW (ref 3.5–5.1)
Sodium: 139 mmol/L (ref 135–145)

## 2021-06-30 LAB — CBC WITH DIFFERENTIAL/PLATELET
Band Neutrophils: 5 %
Basophils Absolute: 0 10*3/uL (ref 0.0–0.1)
Basophils Relative: 0 %
Eosinophils Absolute: 0.1 10*3/uL (ref 0.0–0.5)
Eosinophils Relative: 2 %
HCT: 24.2 % — ABNORMAL LOW (ref 36.0–46.0)
Hemoglobin: 8.1 g/dL — ABNORMAL LOW (ref 12.0–15.0)
Lymphocytes Relative: 4 %
Lymphs Abs: 0.2 10*3/uL — ABNORMAL LOW (ref 0.7–4.0)
MCH: 29.1 pg (ref 26.0–34.0)
MCHC: 33.5 g/dL (ref 30.0–36.0)
MCV: 87.1 fL (ref 80.0–100.0)
Metamyelocytes Relative: 2 %
Monocytes Absolute: 0.1 10*3/uL (ref 0.1–1.0)
Monocytes Relative: 3 %
Myelocytes: 3 %
Neutro Abs: 3.4 10*3/uL (ref 1.7–7.7)
Neutrophils Relative %: 81 %
Platelets: 22 10*3/uL — CL (ref 150–400)
RBC: 2.78 MIL/uL — ABNORMAL LOW (ref 3.87–5.11)
RDW: 16.6 % — ABNORMAL HIGH (ref 11.5–15.5)
Smear Review: DECREASED
WBC: 4 10*3/uL (ref 4.0–10.5)
nRBC: 0.7 % — ABNORMAL HIGH (ref 0.0–0.2)

## 2021-06-30 LAB — TYPE AND SCREEN
ABO/RH(D): B POS
Antibody Screen: NEGATIVE
Unit division: 0

## 2021-06-30 LAB — BPAM RBC
Blood Product Expiration Date: 202305262359
ISSUE DATE / TIME: 202304231600
Unit Type and Rh: 5100

## 2021-06-30 LAB — MAGNESIUM: Magnesium: 1.8 mg/dL (ref 1.7–2.4)

## 2021-06-30 LAB — PATHOLOGIST SMEAR REVIEW

## 2021-06-30 MED ORDER — SODIUM CHLORIDE 0.9% IV SOLUTION: Freq: Once | INTRAVENOUS | Status: DC

## 2021-06-30 MED ORDER — POTASSIUM CHLORIDE IN NACL 40-0.9 MEQ/L-% IV SOLN: INTRAVENOUS | Status: DC

## 2021-06-30 NOTE — Progress Notes (Signed)
?  ?       ?PROGRESS NOTE ? ?Jenny Martinez:[id number] DOB: 07-15-1945 DOA: 06/26/2021 ?PCP: Jettie Booze, NP ? ?Brief History:  ?76 year old female with a history of hypertension and anal squamous cell carcinoma presenting with fevers, chills, and abdominal pain that started on 06/24/2021.  The patient had her chemotherapy infusion pump removed on 06/20/2021.  She has been receiving mitomycin and 5-FU since her diagnosis of anal squamous cell carcinoma noted on colonoscopy on 04/17/2021.  She has also been receiving radiation on a daily basis.  She was due to finish radiation on the first week of May 2023.  Nevertheless, the patient received IV fluids and potassium infusion on 06/24/2021 at the cancer center at [facility].   That evening, she began having high fevers up to 104.0 ?F with abdominal pain.  She denies any headache, chest pain, shortness of breath, hemoptysis, vomiting.  She has had loose stools that have slightly worsened since chemotherapy.  There is no hematochezia or melena.  She denies any dysuria or hematuria. ?In the ED, the patient had a fever up to 103.3 ?F.  She was hemodynamically stable but tachycardic into the 130s.  Oxygen saturation was 98% on room air.  WBC 0.3, hemoglobin 8.4, platelets 62,000.  Sodium 137, potassium 3.3, bicarbonate 24, serum creatinine 0.99.  UA was negative for pyuria.  CT of the abdomen and pelvis showed abnormal wall thickening in the distal and terminal ileum.  There is also adjacent inflammatory stranding without loculated fluid collection.  There is no obstruction or hydronephrosis.  The patient was initially started on vancomycin, cefepime, and metronidazole.  She was admitted for further evaluation and treatment of her neutropenic fever.  ? ? ?Assessment and Plan: ?* Febrile neutropenia ([place]) ?ANC = 108 at time of admission ?06/29/21 ANC 1065>>3440 on 4/24 ?Etiology is likely neutropenic enteritis ?Continue IV fluids ?Continuet IV meropenem ?UA negative for  pyuria ?Chest x-ray negative for infiltrates ?Follow blood cultures and urine cultures--neg to date ?Continue G-CSF>>hold after 4/23 dose ?Fevers overall improved and ANC improving ? ?Sepsis due to undetermined organism [facility]) ?Neutropenic enteritis ?Presented with fever and tachycardia and neutropenia ?Continue IV fluids ?continue meropenem ?Follow blood cultures--neg to date ?Urine culture neg ?Personally reviewed chest x-ray--no infiltrates or edema ? ?Enteritis ?Neutropenic Enteritis ?4/20 CT abd--abnormal wall thickening in the distal and terminal ileum with adjacent inflammatory stranding ?Continue meropenem ?Stool pathogen panel--neg ?Check C. Difficile--pending ? ?Pancytopenia ([place]) ?Pancytopenia with leukopenia of 0.3, thrombocytopenia of 62, and anemia hemoglobin 8.4-baseline 11-13.   ?Likely secondary to chemotherapy-she is on mitomycin and 5-FU. ?4/23--transfused one unit PRBC ?4/24--tranfuse one unit platelets ? 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 Georges Lynch ?She is on chemoradiation therapy.   ?chemoinfusion pump removed 06/20/21 after last dose  ? ? ?Intractable vomiting ?Pt with intolerances to compazine and zofran ?-discussed risks of reglan>>pt willing to try it ?-start reglan q 6 ?Start pantoprazole bid ?2 view AXR-mild gaseous SB distensino ?--had 2 BMs last 24 hours ?4/24--no emesis in last 24 hours ? ?Urine retention ?Foley placed in ED ?1000cc out with foley insertion ?d/c foley for voiding trial 4/23>>able to void without difficulty ? ?Hypertension ?Stable. ?-Hold 10 mg lisinopril for now with contrast exposure and to allow BP margin for metoprolol titration ? ?Family Communication:   daughter updated  4/24 ?  ?Consultants:  none ?  ?Code Status:  FULL ?  ?  DVT Prophylaxis:  SCDs ?  ?  ?Procedures: ?As Listed in Progress Note Above ?  ?Antibiotics: ?Merrem 4/21>> ?Vanc 4/21>>4/23 ?   ? ? ? ? ? ? ?Subjective: ?Patient states vomiting is improving.  She still has abd pain but it is improving.  Denies any cp, sob, hemoptysis, headache, neck pain ? ?Objective: ?Vitals:  ? 06/29/21 1845 06/29/21 2023 06/30/21 0507 06/30/21 0515  ?BP: (!) 156/59 (!) 153/67 (!) 160/63   ?Pulse: (!) 105 (!) 104 (!) 120 (!) 102  ?Resp: '20 18 16   '$ ?Temp: 98.9 ?F (37.2 ?C) 98.5 ?F (36.9 ?C) 98 ?F (36.7 ?C)   ?TempSrc: Oral Oral Oral   ?SpO2: 97% 94% 91%   ?Weight:      ?Height:      ? ? ?Intake/Output Summary (Last 24 hours) at 06/30/2021 1257 ?Last data filed at 06/30/2021 1127 ?Gross per 24 hour  ?Intake 1250.99 ml  ?Output 2 ml  ?Net 1248.99 ml  ? ?Weight change:  ?Exam: ? ?General:  Pt is alert, follows commands appropriately, not in acute distress ?HEENT: No icterus, No thrush, No neck mass, [place]/AT ?Cardiovascular: RRR, S1/S2, no rubs, no gallops ?Respiratory: CTA bilaterally, no wheezing, no crackles, no rhonchi ?Abdomen: Soft/+BS, mild diffuse tender, non distended, no guarding ?Extremities: No edema, No lymphangitis, No petechiae, No rashes, no synovitis ? ? ?Data Reviewed: ?I have personally reviewed following labs and imaging studies ?Basic Metabolic Panel: ?Recent Labs  ?Lab 06/26/21 ?1219 06/27/21 ?5638 06/28/21 ?7564 06/29/21 ?3329 06/30/21 ?0456  ?NA 137 137 135 138 139  ?K 3.3* 4.5 3.6 3.7 3.2*  ?CL 105 109 107 109 112*  ?CO2 24 22 21* 23 21*  ?GLUCOSE 131* 102* 104* 100* 95  ?BUN '14 12 12 13 9  '$ ?CREATININE 0.99 0.85 0.75 0.71 0.65  ?CALCIUM 7.1* 6.9* 6.5* 6.5* 6.5*  ?MG  --   --  1.3* 2.0 1.8  ? ?Liver Function Tests: ?Recent Labs  ?Lab 06/26/21 ?1219 06/28/21 ?0758  ?AST 16 12*  ?ALT 13 10  ?ALKPHOS 56 43  ?BILITOT 0.6 0.5  ?PROT 5.3* 4.3*  ?ALBUMIN 2.4* 1.8*  ? ?No results for input(s): LIPASE, AMYLASE in the last 168 hours. ?No results for input(s): AMMONIA in the last 168 hours. ?Coagulation Profile: ?Recent Labs  ?Lab 06/26/21 ?1219  ?INR 1.3*  ? ?CBC: ?Recent Labs  ?Lab 06/26/21 ?1219 06/27/21 ?5188  06/28/21 ?4166 06/29/21 ?0630 06/29/21 ?2229 06/30/21 ?1601  ?WBC 0.3* 0.3* 0.5* 1.5*  --  4.0  ?NEUTROABS 0.1*  --  0.2* 1.1*  --  3.4  ?HGB 8.4* 7.5* 7.8* 7.0* 8.4* 8.1*  ?HCT 25.1* 22.5* 24.0* 21.7* 25.6* 24.2*  ?MCV 87.8 88.2 87.6 89.3  --  87.1  ?PLT 62* 38* 29* 25*  --  22*  ? ?Cardiac Enzymes: ?No results for input(s): CKTOTAL, CKMB, CKMBINDEX, TROPONINI in the last 168 hours. ?BNP: ?Invalid input(s): POCBNP ?CBG: ?No results for input(s): GLUCAP in the last 168 hours. ?HbA1C: ?No results for input(s): HGBA1C in the last 72 hours. ?Urine analysis: ?   ?Component Value Date/Time  ? [place] YELLOW 06/26/2021 1850  ? APPEARANCEUR CLEAR 06/26/2021 1850  ? LABSPEC 1.032 (H) 06/26/2021 1850  ? PHURINE 5.0 06/26/2021 1850  ? [place] NEGATIVE 06/26/2021 1850  ? [place] NEGATIVE 06/26/2021 1850  ? [place] NEGATIVE 06/26/2021 1850  ? KETONESUR 20 (A) 06/26/2021 1850  ? PROTEINUR 30 (A) 06/26/2021 1850  ? NITRITE NEGATIVE 06/26/2021 1850  ? LEUKOCYTESUR NEGATIVE 06/26/2021 1850  ? ?Sepsis Labs: ?@  LABRCNTIP(procalcitonin:4,lacticidven:4) ?) ?Recent Results (from the past 240 hour(s))  ?Culture, blood (Routine x 2)     Status: None (Preliminary result)  ? Collection Time: 06/26/21 12:11 PM  ? Specimen: BLOOD  ?Result Value Ref Range Status  ? Specimen Description BLOOD RIGHT ANTECUBITAL  Final  ? Special Requests   Final  ?  BOTTLES DRAWN AEROBIC AND ANAEROBIC Blood Culture adequate volume  ? Culture   Final  ?  NO GROWTH 3 DAYS ?Performed at [facility], [address], [place], [place] [address] ?  ? Report Status PENDING  Incomplete  ?Culture, blood (Routine x 2)     Status: None (Preliminary result)  ? Collection Time: 06/26/21 12:11 PM  ? Specimen: BLOOD  ?Result Value Ref Range Status  ? Specimen Description BLOOD LEFT ANTECUBITAL  Final  ? Special Requests   Final  ?  BOTTLES DRAWN AEROBIC AND ANAEROBIC Blood Culture adequate volume  ? Culture   Final  ?  NO GROWTH 3 DAYS ?Performed at [facility],  [address] [place],  [address] ?  ? Report Status PENDING  Incomplete  ?Resp Panel by RT-PCR (Flu A&B, Covid) Nasopharyngeal Swab     Status: None  ? Collection Time: 06/26/21 12:31 PM  ? Specimen: Nasopharyngeal

## 2021-07-01 ENCOUNTER — Inpatient Hospital Stay (HOSPITAL_COMMUNITY): Payer: Medicare Other

## 2021-07-01 DIAGNOSIS — C210 Malignant neoplasm of anus, unspecified: Secondary | ICD-10-CM | POA: Diagnosis not present

## 2021-07-01 DIAGNOSIS — R1110 Vomiting, unspecified: Secondary | ICD-10-CM | POA: Diagnosis not present

## 2021-07-01 DIAGNOSIS — K529 Noninfective gastroenteritis and colitis, unspecified: Secondary | ICD-10-CM | POA: Diagnosis not present

## 2021-07-01 DIAGNOSIS — D709 Neutropenia, unspecified: Secondary | ICD-10-CM | POA: Diagnosis not present

## 2021-07-01 LAB — BPAM PLATELET PHERESIS
Blood Product Expiration Date: 202304262359
ISSUE DATE / TIME: 202304241511
Unit Type and Rh: 5100

## 2021-07-01 LAB — BASIC METABOLIC PANEL
Anion gap: 6 (ref 5–15)
BUN: 5 mg/dL — ABNORMAL LOW (ref 8–23)
CO2: 21 mmol/L — ABNORMAL LOW (ref 22–32)
Calcium: 7 mg/dL — ABNORMAL LOW (ref 8.9–10.3)
Chloride: 114 mmol/L — ABNORMAL HIGH (ref 98–111)
Creatinine, Ser: 0.63 mg/dL (ref 0.44–1.00)
GFR, Estimated: >60 mL/min (ref >60)
Glucose, Bld: 100 mg/dL — ABNORMAL HIGH (ref 70–99)
Potassium: 4.3 mmol/L (ref 3.5–5.1)
Sodium: 141 mmol/L (ref 135–145)

## 2021-07-01 LAB — MAGNESIUM: Magnesium: 1.7 mg/dL (ref 1.7–2.4)

## 2021-07-01 LAB — CBC WITH DIFFERENTIAL/PLATELET
Basophils Absolute: 0 10*3/uL (ref 0.0–0.1)
Basophils Relative: 0 %
Eosinophils Absolute: 0.1 10*3/uL (ref 0.0–0.5)
Eosinophils Relative: 1 %
HCT: 24.8 % — ABNORMAL LOW (ref 36.0–46.0)
Hemoglobin: 8 g/dL — ABNORMAL LOW (ref 12.0–15.0)
Lymphocytes Relative: 3 %
Lymphs Abs: 0.2 10*3/uL — ABNORMAL LOW (ref 0.7–4.0)
MCH: 28.5 pg (ref 26.0–34.0)
MCHC: 32.3 g/dL (ref 30.0–36.0)
MCV: 88.3 fL (ref 80.0–100.0)
Metamyelocytes Relative: 2 %
Monocytes Absolute: 0.4 10*3/uL (ref 0.1–1.0)
Monocytes Relative: 7 %
Neutro Abs: 4.4 10*3/uL (ref 1.7–7.7)
Neutrophils Relative %: 87 %
Platelets: 45 10*3/uL — ABNORMAL LOW (ref 150–400)
RBC: 2.81 MIL/uL — ABNORMAL LOW (ref 3.87–5.11)
RDW: 17.1 % — ABNORMAL HIGH (ref 11.5–15.5)
WBC: 5 10*3/uL (ref 4.0–10.5)
nRBC: 0.6 % — ABNORMAL HIGH (ref 0.0–0.2)

## 2021-07-01 LAB — PREPARE PLATELET PHERESIS: Unit division: 0

## 2021-07-01 MED ORDER — MAGNESIUM SULFATE 2 GM/50ML IV SOLN
2.0000 g | Freq: Once | INTRAVENOUS | Status: AC
  Administered 2021-07-01: 2 g via INTRAVENOUS
  Filled 2021-07-01: qty 50

## 2021-07-01 MED ORDER — LISINOPRIL 10 MG PO TABS
10.0000 mg | ORAL_TABLET | Freq: Every day | ORAL | Status: DC
Start: 2021-07-01 — End: 2021-07-02
  Administered 2021-07-01 – 2021-07-02 (×2): 10 mg via ORAL
  Filled 2021-07-01 (×2): qty 1

## 2021-07-01 MED ORDER — CYCLOBENZAPRINE HCL 10 MG PO TABS
10.0000 mg | ORAL_TABLET | Freq: Once | ORAL | Status: AC
  Administered 2021-07-01: 10 mg via ORAL
  Filled 2021-07-01: qty 1

## 2021-07-01 NOTE — Progress Notes (Signed)
Pt reports feeling SOB and winded, checked 02 sats, 02sats 99-100% on room air.Pt states that it may be anxiety related. States that she feels more SOB when laying down. Bed was in a flat position. Raised HOB to 45.  Provider Obyd notified. Will continue to monitor, call bell within reach.  ? 07/01/21 0206  ?Vitals  ?Pulse Rate (!) 110  ?Level of Consciousness  ?Level of Consciousness Alert  ?MEWS COLOR  ?MEWS Score Color Green  ?Oxygen Therapy  ?SpO2 100 %  ?O2 Device Room Air  ?MEWS Score  ?MEWS Temp 0  ?MEWS Systolic 0  ?MEWS Pulse 1  ?MEWS RR 0  ?MEWS LOC 0  ?MEWS Score 1  ? ? ?

## 2021-07-01 NOTE — Progress Notes (Addendum)
?  ?       ?PROGRESS NOTE ? ?Jenny Martinez NFA:[id number] DOB: May 05, 1945 DOA: 06/26/2021 ?PCP: Jettie Booze, NP ? ?Brief History:  ?76 year old female with a history of hypertension and anal squamous cell carcinoma presenting with fevers, chills, and abdominal pain that started on 06/24/2021.  The patient had her chemotherapy infusion pump removed on 06/20/2021.  She has been receiving mitomycin and 5-FU since her diagnosis of anal squamous cell carcinoma noted on colonoscopy on 04/17/2021.  She has also been receiving radiation on a daily basis.  She was due to finish radiation on the first week of May 2023.  Nevertheless, the patient received IV fluids and potassium infusion on 06/24/2021 at the cancer center at [facility].   That evening, she began having high fevers up to 104.0 ?F with abdominal pain.  She denies any headache, chest pain, shortness of breath, hemoptysis, vomiting.  She has had loose stools that have slightly worsened since chemotherapy.  There is no hematochezia or melena.  She denies any dysuria or hematuria. ?In the ED, the patient had a fever up to 103.3 ?F.  She was hemodynamically stable but tachycardic into the 130s.  Oxygen saturation was 98% on room air.  WBC 0.3, hemoglobin 8.4, platelets 62,000.  Sodium 137, potassium 3.3, bicarbonate 24, serum creatinine 0.99.  UA was negative for pyuria.  CT of the abdomen and pelvis showed abnormal wall thickening in the distal and terminal ileum.  There is also adjacent inflammatory stranding without loculated fluid collection.  There is no obstruction or hydronephrosis.  The patient was initially started on vancomycin, cefepime, and metronidazole.  She was admitted for further evaluation and treatment of her neutropenic fever.  ? ? ?Assessment and Plan: ?* Febrile neutropenia ([place]) ?ANC = 108 at time of admission ?06/29/21 ANC 1065>>4350 on 4/25 ?Etiology is likely neutropenic enteritis ?Continue IV fluids>>decrease rate ?Continuet IV meropenem ?UA  negative for pyuria ?Chest x-ray negative for infiltrates ?Follow blood cultures and urine cultures--neg to date ?Continue G-CSF>>hold after 4/23 dose ?Fevers overall improved (no fever x 48hr) and ANC improving ? ?Sepsis due to undetermined organism [facility]) ?Neutropenic enteritis ?Presented with fever and tachycardia and neutropenia ?Continue IV fluids ?continue meropenem ?Follow blood cultures--neg to date ?Urine culture neg ?Personally reviewed chest x-ray--no infiltrates or edema ?Remains tachycardic but much improved;  No fever x 48 hours ?--personally reviewed CXR 4/25--bibasilar atelectatsis ? ?Enteritis ?Neutropenic Enteritis ?4/20 CT abd--abnormal wall thickening in the distal and terminal ileum with adjacent inflammatory stranding ?Continue meropenem ?Stool pathogen panel--neg ?Check C. Difficile--not collected ?No further diarrhea; abd pain improved ?Advance diet to full liquids on 4/25 ? ?Pancytopenia ([place]) ?Pancytopenia with leukopenia of 0.3, thrombocytopenia of 62, and anemia hemoglobin 8.4-baseline 11-13.   ?Likely secondary to chemotherapy-she is on mitomycin and 5-FU. ?4/23--transfused one unit PRBC>>Hgb remains stable ?4/24--tranfuse one unit platelets ? 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 Georges Lynch ?She is on chemoradiation therapy.   ?chemoinfusion pump removed 06/20/21 after last dose  ? ? ?Intractable vomiting ?Pt with intolerances to compazine and zofran ?-discussed risks of reglan>>pt willing to try it ?-continue reglan q 6 ?Continue pantoprazole bid ?2 view AXR-mild gaseous SB distensino ?--had 2 BMs last 24 hours ?4/24--no emesis in last 36 hours ?4/25--advance diet to full liquids ? ?Urine retention ?Foley placed in ED ?1000cc out with foley insertion ?d/c foley for voiding trial 4/23>>able to void without difficulty ? ?  Hypertension ?Stable. ?-Hold 10 mg lisinopril for now with  contrast exposure and to allow BP margin for metoprolol titration>>restart 4/25 ? ?Family Communication:   daughter updated  4/25 ?  ?Consultants:  none ?  ?Code Status:  FULL ?  ?DVT Prophylaxis:  SCDs ?  ?  ?Procedures: ?As Listed in Progress Note Above ?  ?Antibiotics: ?Merrem 4/21>> ?Vanc 4/21>>4/23 ?  ? ? ? ? ? ? ?Subjective: ?Patient is feeling better. Denies f/c, cp, n/v/d.  Abd pain is improving.  2 soft BMs without blood last 24 hours ? ?Objective: ?Vitals:  ? 06/30/21 2143 07/01/21 0206 07/01/21 0623 07/01/21 1450  ?BP: (!) 153/65  (!) 157/72 (!) 177/72  ?Pulse: (!) 110 (!) 110 (!) 110 (!) 114  ?Resp: '18  20 20  '$ ?Temp: 98.9 ?F (37.2 ?C)  98.7 ?F (37.1 ?C) 98.9 ?F (37.2 ?C)  ?TempSrc: Oral  Oral Oral  ?SpO2: 95% 100% 95% 97%  ?Weight:      ?Height:      ? ? ?Intake/Output Summary (Last 24 hours) at 07/01/2021 1530 ?Last data filed at 07/01/2021 1300 ?Gross per 24 hour  ?Intake 4315.72 ml  ?Output --  ?Net 4315.72 ml  ? ?Weight change:  ?Exam: ? ?General:  Pt is alert, follows commands appropriately, not in acute distress ?HEENT: No icterus, No thrush, No neck mass, [place]/AT ?Cardiovascular: RRR, S1/S2, no rubs, no gallops ?Respiratory: bibasilar crackles. No wheeze ?Abdomen: Soft/+BS, non tender, non distended, no guarding ?Extremities: No edema, No lymphangitis, No petechiae, No rashes, no synovitis ? ? ?Data Reviewed: ?I have personally reviewed following labs and imaging studies ?Basic Metabolic Panel: ?Recent Labs  ?Lab 06/27/21 ?7096 06/28/21 ?2836 06/29/21 ?6294 06/30/21 ?7654 07/01/21 ?6503  ?NA 137 135 138 139 141  ?K 4.5 3.6 3.7 3.2* 4.3  ?CL 109 107 109 112* 114*  ?CO2 22 21* 23 21* 21*  ?GLUCOSE 102* 104* 100* 95 100*  ?BUN '12 12 13 9 '$ 5*  ?CREATININE 0.85 0.75 0.71 0.65 0.63  ?CALCIUM 6.9* 6.5* 6.5* 6.5* 7.0*  ?MG  --  1.3* 2.0 1.8 1.7  ? ?Liver Function Tests: ?Recent Labs  ?Lab 06/26/21 ?1219 06/28/21 ?0758  ?AST 16 12*  ?ALT 13 10  ?ALKPHOS 56 43  ?BILITOT 0.6 0.5  ?PROT 5.3* 4.3*  ?ALBUMIN 2.4*  1.8*  ? ?No results for input(s): LIPASE, AMYLASE in the last 168 hours. ?No results for input(s): AMMONIA in the last 168 hours. ?Coagulation Profile: ?Recent Labs  ?Lab 06/26/21 ?1219  ?INR 1.3*  ? ?CBC: ?Recent Labs  ?Lab 06/26/21 ?1219 06/27/21 ?5465 06/28/21 ?6812 06/29/21 ?7517 06/29/21 ?2229 06/30/21 ?0017 07/01/21 ?4944  ?WBC 0.3* 0.3* 0.5* 1.5*  --  4.0 5.0  ?NEUTROABS 0.1*  --  0.2* 1.1*  --  3.4 4.4  ?HGB 8.4* 7.5* 7.8* 7.0* 8.4* 8.1* 8.0*  ?HCT 25.1* 22.5* 24.0* 21.7* 25.6* 24.2* 24.8*  ?MCV 87.8 88.2 87.6 89.3  --  87.1 88.3  ?PLT 62* 38* 29* 25*  --  22* 45*  ? ?Cardiac Enzymes: ?No results for input(s): CKTOTAL, CKMB, CKMBINDEX, TROPONINI in the last 168 hours. ?BNP: ?Invalid input(s): POCBNP ?CBG: ?No results for input(s): GLUCAP in the last 168 hours. ?HbA1C: ?No results for input(s): HGBA1C in the last 72 hours. ?Urine analysis: ?   ?Component Value Date/Time  ? [place] YELLOW 06/26/2021 1850  ? APPEARANCEUR CLEAR 06/26/2021 1850  ? LABSPEC 1.032 (H) 06/26/2021 1850  ? PHURINE 5.0 06/26/2021 1850  ? [place] NEGATIVE 06/26/2021 1850  ?  [place] NEGATIVE 06/26/2021 1850  ? [place] NEGATIVE 06/26/2021 1850  ? KETONESUR 20 (A) 06/26/2021 1850  ? PROTEINUR 30 (A) 06/26/2021 1850  ? NITRITE NEGATIVE 06/26/2021 1850  ? LEUKOCYTESUR NEGATIVE 06/26/2021 1850  ? ?Sepsis Labs: ?'@LABRCNTIP'$ (procalcitonin:4,lacticidven:4) ?) ?Recent Results (from the past 240 hour(s))  ?Culture, blood (Routine x 2)     Status: None (Preliminary result)  ? Collection Time: 06/26/21 12:11 PM  ? Specimen: BLOOD  ?Result Value Ref Range Status  ? Specimen Description BLOOD RIGHT ANTECUBITAL  Final  ? Special Requests   Final  ?  BOTTLES DRAWN AEROBIC AND ANAEROBIC Blood Culture adequate volume  ? Culture   Final  ?  NO GROWTH 3 DAYS ?Performed at [facility], [address] [place], [place] [address] ?  ? Report Status PENDING  Incomplete  ?Culture, blood (Routine x 2)     Status: None (Preliminary result)  ? Collection Time:  06/26/21 12:11 PM  ? Specimen: BLOOD  ?Result Value Ref Range Status  ? Specimen Description BLOOD LEFT ANTECUBITAL  Final  ? Special Requests   Final  ?  BOTTLES DRAWN AEROBIC AND ANAEROBIC Blood Culture adequa

## 2021-07-01 NOTE — Plan of Care (Signed)

## 2021-07-01 NOTE — Progress Notes (Signed)
?   07/01/21 1450  ?Assess: MEWS Score  ?Temp 98.9 ?F (37.2 ?C)  ?BP (!) 177/72 ?(PT said she feels fine, she just had come from the bathroom before vitals were taken, she thinks moving around made BP rise, RN notidied)  ?Pulse Rate (!) 114  ?Resp 20  ?SpO2 97 %  ?O2 Device Room Air  ?Assess: MEWS Score  ?MEWS Temp 0  ?MEWS Systolic 0  ?MEWS Pulse 2  ?MEWS RR 0  ?MEWS LOC 0  ?MEWS Score 2  ?MEWS Score Color Yellow  ? ? ?

## 2021-07-02 DIAGNOSIS — C210 Malignant neoplasm of anus, unspecified: Secondary | ICD-10-CM | POA: Diagnosis not present

## 2021-07-02 DIAGNOSIS — K529 Noninfective gastroenteritis and colitis, unspecified: Secondary | ICD-10-CM | POA: Diagnosis not present

## 2021-07-02 DIAGNOSIS — I10 Essential (primary) hypertension: Secondary | ICD-10-CM | POA: Diagnosis not present

## 2021-07-02 DIAGNOSIS — D709 Neutropenia, unspecified: Secondary | ICD-10-CM | POA: Diagnosis not present

## 2021-07-02 LAB — CBC WITH DIFFERENTIAL/PLATELET
Abs Immature Granulocytes: 0.09 10*3/uL — ABNORMAL HIGH (ref 0.00–0.07)
Basophils Absolute: 0 10*3/uL (ref 0.0–0.1)
Basophils Relative: 1 %
Eosinophils Absolute: 0 10*3/uL (ref 0.0–0.5)
Eosinophils Relative: 1 %
HCT: 26.3 % — ABNORMAL LOW (ref 36.0–46.0)
Hemoglobin: 8.5 g/dL — ABNORMAL LOW (ref 12.0–15.0)
Immature Granulocytes: 2 %
Lymphocytes Relative: 4 %
Lymphs Abs: 0.2 10*3/uL — ABNORMAL LOW (ref 0.7–4.0)
MCH: 28.2 pg (ref 26.0–34.0)
MCHC: 32.3 g/dL (ref 30.0–36.0)
MCV: 87.4 fL (ref 80.0–100.0)
Monocytes Absolute: 0.5 10*3/uL (ref 0.1–1.0)
Monocytes Relative: 12 %
Neutro Abs: 3.6 10*3/uL (ref 1.7–7.7)
Neutrophils Relative %: 80 %
Platelets: 52 10*3/uL — ABNORMAL LOW (ref 150–400)
RBC: 3.01 MIL/uL — ABNORMAL LOW (ref 3.87–5.11)
RDW: 17.3 % — ABNORMAL HIGH (ref 11.5–15.5)
WBC: 4.4 10*3/uL (ref 4.0–10.5)
nRBC: 0.5 % — ABNORMAL HIGH (ref 0.0–0.2)

## 2021-07-02 LAB — CULTURE, BLOOD (ROUTINE X 2)
Culture: NO GROWTH
Culture: NO GROWTH
Special Requests: ADEQUATE
Special Requests: ADEQUATE

## 2021-07-02 LAB — BASIC METABOLIC PANEL
Anion gap: 9 (ref 5–15)
BUN: <5 mg/dL — ABNORMAL LOW (ref 8–23)
CO2: 22 mmol/L (ref 22–32)
Calcium: 7.1 mg/dL — ABNORMAL LOW (ref 8.9–10.3)
Chloride: 105 mmol/L (ref 98–111)
Creatinine, Ser: 0.64 mg/dL (ref 0.44–1.00)
GFR, Estimated: >60 mL/min (ref >60)
Glucose, Bld: 99 mg/dL (ref 70–99)
Potassium: 4.3 mmol/L (ref 3.5–5.1)
Sodium: 136 mmol/L (ref 135–145)

## 2021-07-02 LAB — MAGNESIUM: Magnesium: 1.8 mg/dL (ref 1.7–2.4)

## 2021-07-02 MED ORDER — METOPROLOL SUCCINATE ER 25 MG PO TB24
25.0000 mg | ORAL_TABLET | Freq: Two times a day (BID) | ORAL | Status: DC
  Administered 2021-07-02: 25 mg via ORAL
  Filled 2021-07-02: qty 1

## 2021-07-02 MED ORDER — HEPARIN SOD (PORK) LOCK FLUSH 100 UNIT/ML IV SOLN
500.0000 [IU] | INTRAVENOUS | Status: AC | PRN
  Administered 2021-07-02: 500 [IU]
  Filled 2021-07-02: qty 5

## 2021-07-02 MED ORDER — PROMETHAZINE HCL 25 MG PO TABS: 25.0000 mg | ORAL_TABLET | Freq: Four times a day (QID) | ORAL | 0 refills | Status: AC | PRN

## 2021-07-02 MED ORDER — METOCLOPRAMIDE HCL 5 MG PO TABS: 5.0000 mg | ORAL_TABLET | Freq: Three times a day (TID) | ORAL | 1 refills | Status: AC

## 2021-07-02 NOTE — Discharge Summary (Signed)
Physician Discharge Summary  ?Jenny Martinez ZOX:[id number] DOB: 01/01/1946 DOA: 06/26/2021 ? ?PCP: Jettie Booze, NP ?Oncologist: Oswald Hillock ?Admit date: 06/26/2021 ?Discharge date: 07/02/2021 ? ?Admitted From:  Home  ?Disposition: Home  ? ?Recommendations for Outpatient Follow-up:  ?Follow up with PCP in 1 weeks ?Follow up with oncologist in 1 week for recheck  ?Please obtain BMP/CBC in 1-2 weeks for follow up ?Please consider outpatient palliative medicine consultation given high 1 year mortality risk   ? ?Discharge Condition: STABLE   ?CODE STATUS: FULL  ?DIET: soft foods recommended   ? ?Brief Hospitalization Summary: ?Please see all hospital notes, images, labs for full details of the hospitalization. ?Brief History:  ?76 year old female with a history of hypertension and anal squamous cell carcinoma presenting with fevers, chills, and abdominal pain that started on 06/24/2021.  The patient had her chemotherapy infusion pump removed on 06/20/2021.  She has been receiving mitomycin and 5-FU since her diagnosis of anal squamous cell carcinoma noted on colonoscopy on 04/17/2021.  She has also been receiving radiation on a daily basis.  She was due to finish radiation on the first week of May 2023.  Nevertheless, the patient received IV fluids and potassium infusion on 06/24/2021 at the cancer center at [facility].   That evening, she began having high fevers up to 104.0 ?F with abdominal pain.  She denies any headache, chest pain, shortness of breath, hemoptysis, vomiting.  She has had loose stools that have slightly worsened since chemotherapy.  There is no hematochezia or melena.  She denies any dysuria or hematuria.  In the ED, the patient had a fever up to 103.3 ?F.  She was hemodynamically stable but tachycardic into the 130s.  Oxygen saturation was 98% on room air.  WBC 0.3, hemoglobin 8.4, platelets 62,000.  Sodium 137, potassium 3.3, bicarbonate 24, serum creatinine 0.99.  UA was negative for pyuria.  CT of the  abdomen and pelvis showed abnormal wall thickening in the distal and terminal ileum.  There is also adjacent inflammatory stranding without loculated fluid collection.  There is no obstruction or hydronephrosis.  The patient was initially started on vancomycin, cefepime, and metronidazole.  She was admitted for further evaluation and treatment of her neutropenic fever.  ?   ?Assessment and Plan: ?Febrile neutropenia  - Resolved now ?ANC = 108 at time of admission ?06/29/21 ANC 1065>>4350 on 4/25 ?Etiology is likely neutropenic enteritis ?Continue IV fluids>>decrease rate ?Continuet IV meropenem ?UA negative for pyuria ?Chest x-ray negative for infiltrates ?Follow blood cultures and urine cultures--neg to date ?Continue G-CSF>>hold after 4/23 dose ?Fevers resolved (no fever x 72+hr) and ANC improving ?  ?Sepsis due to undetermined organism - Resolved now ?Thought secondary to neutropenic enteritis ?Presented with fever and tachycardia and neutropenia ?Treated with IV fluids ?Treated with meropenem ?Completed 7 days of IV antibiotics in hospital ?Follow blood cultures--neg to date ?Urine culture neg ?Personally reviewed chest x-ray--no infiltrates or edema ?Remains tachycardic but much improved;  No fever x 48 hours ?--personally reviewed CXR 4/25--bibasilar atelectatsis ?  ?Enteritis- Treated  ?Neutropenic Enteritis ?4/20 CT abd--abnormal wall thickening in the distal and terminal ileum with adjacent inflammatory stranding ?Treated with IV meropenem ?Stool pathogen panel--neg ?Check C. Difficile--not collected ?No further diarrhea; abd pain improved ?Advance diet to soft foods diet and tolerated ?DC home  ?  ?Pancytopenia ([place]) ?Pancytopenia with leukopenia of 0.3, thrombocytopenia of 62, and anemia hemoglobin 8.4-baseline 11-13.   ?Likely secondary to chemotherapy-she is on mitomycin and 5-FU. ?4/23--transfused one unit  PRBC>>Hgb remains stable ?4/24--tranfused one unit platelets ?4/26: platelets stable at 52 today ?   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 Georges Lynch ?She is on chemoradiation therapy.   ?chemoinfusion pump removed 06/20/21 after last dose  ?   ?Intractable vomiting - Improved now ?Pt with intolerances to compazine and zofran ?-discussed risks of reglan>>pt willing to try it ?-continue reglan q 6 on temporary basis ?Continue PPI therapy ?2 view AXR-mild gaseous SB distension ?--had 2 BMs last 24 hours (nonbloody) ?4/24--no emesis in last 36 hours ?4/25--advance diet to full liquids ?4/16 - advanced to soft foods diet ?  ?Acute Urinary retention ?Foley placed in ED ?1000cc out with foley insertion ?d/c foley for voiding trial 4/23>>able to void without difficulty ?  ?Hypertension ?Stable. ?-resume home meds at DC  ?  ?Family Communication:  updated bedside 4/26 ?  ?Consultants:  none ?  ?Code Status:  FULL ?  ?DVT Prophylaxis:  SCDs ?  ?Discharge Diagnoses:  ?Principal Problem: ?  Febrile neutropenia ([place]) ?Active Problems: ?  Hypertension ?  Anal cancer ([place]) ?  Pancytopenia ([place]) ?  Sepsis due to undetermined organism [facility]) ?  Enteritis ?  Urine retention ?  Intractable vomiting ? ?Discharge Instructions: ? ?Allergies as of 07/02/2021   ? ?   Reactions  ? Levaquin [levofloxacin In D5w] Nausea And Vomiting  ? Morphine And Related Nausea And Vomiting  ? Prednisolone Acetate   ? Liquid medication.  Made heart race and couldn't catch breath  ? ?  ? ?  ?Medication List  ?  ? ?STOP taking these medications   ? ?methylPREDNISolone 4 MG Tbpk tablet ?Commonly known as: MEDROL DOSEPAK ?  ?ondansetron 4 MG disintegrating tablet ?Commonly known as: Zofran ODT ?  ? ?  ? ?TAKE these medications   ? ?ALPRAZolam 0.5 MG tablet ?Commonly known as: Duanne Moron ?Take 0.5 mg by mouth at bedtime. ?  ?cyclobenzaprine 10 MG tablet ?Commonly known as: FLEXERIL ?Take 10 mg by mouth at bedtime. ?  ?dicyclomine 10 MG capsule ?Commonly known as:  BENTYL ?Take 10 mg by mouth 4 (four) times daily - after meals and at bedtime. ?  ?estradiol 1 MG tablet ?Commonly known as: ESTRACE ?Take 1 mg by mouth daily. ?  ?lisinopril 10 MG tablet ?Commonly known as: ZESTRIL ?Take 10 mg by mouth daily. ?  ?metoCLOPramide 5 MG tablet ?Commonly known as: Reglan ?Take 1 tablet (5 mg total) by mouth 3 (three) times daily before meals for 14 days. ?  ?metoprolol succinate 25 MG 24 hr tablet ?Commonly known as: TOPROL-XL ?Take 25 mg by mouth 2 (two) times daily. ?  ?omeprazole 20 MG capsule ?Commonly known as: PRILOSEC ?Take 20 mg by mouth 2 (two) times daily. ?  ?promethazine 25 MG tablet ?Commonly known as: PHENERGAN ?Take 1 tablet (25 mg total) by mouth every 6 (six) hours as needed for nausea or vomiting. ?  ?SALINE MIST SPRAY NA ?Place 1 spray into the nose as needed (congestion). ?  ? ?  ? ? Follow-up Information   ? ? Jettie Booze, NP. Schedule an appointment as soon as possible for a visit in 1 week(s).   ?Specialty: Family Medicine ?Why: Hospital Follow Up ?Contact information: ?[address] ?[place] [address] ?[phone] ? ? ?  ?  ? ? Georges Lynch, MD. Schedule an appointment as soon as possible for a visit in 1  week(s).   ?Specialty: Hematology and Oncology ?Why: Hospital Follow Up ?Contact information: ?[address] ?[place] [address] ?[phone] ? ? ?  ?  ? ?  ?  ? ?  ? ?Allergies  ?Allergen Reactions  ? Levaquin [Levofloxacin In D5w] Nausea And Vomiting  ? Morphine And Related Nausea And Vomiting  ? Prednisolone Acetate   ?  Liquid medication.  Made heart race and couldn't catch breath  ? ?Allergies as of 07/02/2021   ? ?   Reactions  ? Levaquin [levofloxacin In D5w] Nausea And Vomiting  ? Morphine And Related Nausea And Vomiting  ? Prednisolone Acetate   ? Liquid medication.  Made heart race and couldn't catch breath  ? ?  ? ?  ?Medication List  ?  ? ?STOP taking these medications   ? ?methylPREDNISolone 4 MG Tbpk  tablet ?Commonly known as: MEDROL DOSEPAK ?  ?ondansetron 4 MG disintegrating tablet ?Commonly known as: Zofran ODT ?  ? ?  ? ?TAKE these medications   ? ?ALPRAZolam 0.5 MG tablet ?Commonly known as: Duanne Moron ?Take

## 2021-07-02 NOTE — Progress Notes (Signed)
Called to pt's room, arrived to find pt leaning over side of bed, actively vomiting dark green colored emesis. Total output 780m. Pt states she thinks that the generic metroprolol she is receiving is not agreeing with her. Pt states she had a few bites of ice cream this am but nothing else. Assigned nurse notified TNigel Sloop LPN and MD JWynetta Emerynotified of current condition. Pt cleaned, bed linens changed. ?

## 2021-07-02 NOTE — Progress Notes (Signed)
?   07/02/21 1422  ?Assess: MEWS Score  ?Temp 98.4 ?F (36.9 ?C)  ?BP (!) 178/88  ?Pulse Rate (!) 128  ?Resp 20  ?SpO2 96 %  ?O2 Device Room Air  ?Assess: MEWS Score  ?MEWS Temp 0  ?MEWS Systolic 0  ?MEWS Pulse 2  ?MEWS RR 0  ?MEWS LOC 0  ?MEWS Score 2  ?MEWS Score Color Yellow  ?Assess: if the MEWS score is Yellow or Red  ?Were vital signs taken at a resting state? No ?(Per patient just went to the [facility])  ?Focused Assessment Change from prior assessment (see assessment flowsheet)  ?Early Detection of Sepsis Score *See Row Information* Low  ?MEWS guidelines implemented *See Row Information* Yes  ?Treat  ?Pain Scale 0-10  ?Pain Score 0  ?Take Vital Signs  ?Increase Vital Sign Frequency  Yellow: Q 2hr X 2 then Q 4hr X 2, if remains yellow, continue Q 4hrs  ?Escalate  ?MEWS: Escalate Yellow: discuss with charge nurse/RN and consider discussing with provider and RRT  ?Notify: Charge Nurse/RN  ?Name of Charge Nurse/RN Notified [place]  ?Date Charge Nurse/RN Notified 07/02/21  ?Time Charge Nurse/RN Notified 1430  ?Notify: Provider  ?Provider Name/Title Md Wynetta Emery  ?Date Provider Notified 07/02/21  ?Time Provider Notified 1430  ?Notification Type Page ?(secure chat)  ?Notification Reason Other (Comment) ?(yellow MEWS prior to discharge)  ?Provider response No new orders ?(Patient can discharge per MD Wynetta Emery.)  ? ? ?

## 2021-07-02 NOTE — Plan of Care (Signed)

## 2021-07-02 NOTE — Care Management Important Message (Signed)
Important Message ? ?Patient Details  ?Name: Jenny Martinez ?MRN: [id number] ?Date of Birth: 10-25-1945 ? ? ?Medicare Important Message Given:  Yes (copy mailed due to patient being on precaution, unable to reach by phone at [phone], [phone]) ? ? ? ? ?Tommy Medal ?07/02/2021, 11:15 AM ?

## 2021-07-02 NOTE — Discharge Instructions (Signed)
IMPORTANT INFORMATION: PAY CLOSE ATTENTION  ? ?PHYSICIAN DISCHARGE INSTRUCTIONS ? ?Follow with Primary care provider  Jettie Booze, NP  and other consultants as instructed by your Hospitalist Physician ? ?SEEK MEDICAL CARE OR RETURN TO EMERGENCY ROOM IF SYMPTOMS COME BACK, WORSEN OR NEW PROBLEM DEVELOPS  ? ?Please note: ?You were cared for by a hospitalist during your hospital stay. Every effort will be made to forward records to your primary care provider.  You can request that your primary care provider send for your hospital records if they have not received them.  Once you are discharged, your primary care physician will handle any further medical issues. Please note that NO REFILLS for any discharge medications will be authorized once you are discharged, as it is imperative that you return to your primary care physician (or establish a relationship with a primary care physician if you do not have one) for your post hospital discharge needs so that they can reassess your need for medications and monitor your lab values. ? ?Please get a complete blood count and chemistry panel checked by your Primary MD at your next visit, and again as instructed by your Primary MD. ? ?Get Medicines reviewed and adjusted: ?Please take all your medications with you for your next visit with your Primary MD ? ?Laboratory/radiological data: ?Please request your Primary MD to go over all hospital tests and procedure/radiological results at the follow up, please ask your primary care provider to get all Hospital records sent to his/her office. ? ?In some cases, they will be blood work, cultures and biopsy results pending at the time of your discharge. Please request that your primary care provider follow up on these results. ? ?If you are diabetic, please bring your blood sugar readings with you to your follow up appointment with primary care.   ? ?Please call and make your follow up appointments as soon as possible.   ? ?Also Note  the following: ?If you experience worsening of your admission symptoms, develop shortness of breath, life threatening emergency, suicidal or homicidal thoughts you must seek medical attention immediately by calling 911 or calling your MD immediately  if symptoms less severe. ? ?You must read complete instructions/literature along with all the possible adverse reactions/side effects for all the Medicines you take and that have been prescribed to you. Take any new Medicines after you have completely understood and accpet all the possible adverse reactions/side effects.  ? ?Do not drive when taking Pain medications or sleeping medications (Benzodiazepines) ? ?Do not take more than prescribed Pain, Sleep and Anxiety Medications. It is not advisable to combine anxiety,sleep and pain medications without talking with your primary care practitioner ? ?Special Instructions: If you have smoked or chewed Tobacco  in the last 2 yrs please stop smoking, stop any regular Alcohol  and or any Recreational drug use. ? ?Wear Seat belts while driving.  Do not drive if taking any narcotic, mind altering or controlled substances or recreational drugs or alcohol.  ? ? ? ? ? ?

## 2021-07-02 NOTE — Progress Notes (Signed)
Patient discharged home today, transported home by husband. Discharge paperwork went over with patient. Patient and family verbalized understanding. Belongings sent home with patient.  ?

## 2021-07-02 NOTE — Evaluation (Signed)
Physical Therapy Evaluation ?Patient Details ?Name: Jenny Martinez ?MRN: [id number] ?DOB: [birth year] ?Today's Date: 07/02/2021 ? ?History of Present Illness ? Jenny Martinez is a 76 y.o. female with medical history significant for squamous cell carcinoma of the anus, hypertension.  Patient presented to the ED with complaints of fever, diffuse abdominal pain and generalized weakness of several days duration.  She denies vomiting, but reports diarrhea.  She reports a mild cough.  No difficulty breathing no chest pain no lower extremity swelling.  Patient is on chemotherapy currently. ?  ?Clinical Impression ? Patient functioning near baseline for functional mobility and gait demonstrating good return for ambulation in room and hallways without loss of balance.  Plan:  Patient discharged from physical therapy to care of nursing for ambulation daily as tolerated for length of stay.  ?   ?   ? ?Recommendations for follow up therapy are one component of a multi-disciplinary discharge planning process, led by the attending physician.  Recommendations may be updated based on patient status, additional functional criteria and insurance authorization. ? ?Follow Up Recommendations No PT follow up ? ?  ?Assistance Recommended at Discharge PRN  ?Patient can return home with the following ? Help with stairs or ramp for entrance ? ?  ?Equipment Recommendations None recommended by PT  ?Recommendations for Other Services ?    ?  ?Functional Status Assessment Patient has not had a recent decline in their functional status  ? ?  ?Precautions / Restrictions Precautions ?Precautions: None ?Restrictions ?Weight Bearing Restrictions: No  ? ?  ? ?Mobility ? Bed Mobility ?Overal bed mobility: Modified Independent ?  ?  ?  ?  ?  ?  ?  ?  ? ?Transfers ?Overall transfer level: Modified independent ?  ?  ?  ?  ?  ?  ?  ?  ?  ?  ? ?Ambulation/Gait ?Ambulation/Gait assistance: Modified independent (Device/Increase time) ?Gait Distance (Feet):  120 Feet ?Assistive device: None ?Gait Pattern/deviations: WFL(Within Functional Limits) ?Gait velocity: decreased ?  ?  ?General Gait Details: grossly WFL demonstrating good return for ambulation in room and hallways without loss of balance ? ?Stairs ?  ?  ?  ?  ?  ? ?Wheelchair Mobility ?  ? ?Modified Rankin (Stroke Patients Only) ?  ? ?  ? ?Balance Overall balance assessment: No apparent balance deficits (not formally assessed) ?  ?  ?  ?  ?  ?  ?  ?  ?  ?  ?  ?  ?  ?  ?  ?  ?  ?  ?   ? ? ? ?Pertinent Vitals/Pain Pain Assessment ?Pain Assessment: No/denies pain  ? ? ?Home Living Family/patient expects to be discharged to:: Private residence ?Living Arrangements: Spouse/significant other ?Available Help at Discharge: Family;Available 24 hours/day ?Type of Home: House ?Home Access: Stairs to enter ?Entrance Stairs-Rails: Right;Left (to wide to reach both) ?Entrance Stairs-Number of Steps: 6-8 ?  ?Home Layout: One level ?Home Equipment: Rolling Walker (2 wheels);Grab bars - tub/shower;Shower seat - built in ?   ?  ?Prior Function Prior Level of Function : Independent/Modified Independent ?  ?  ?  ?  ?  ?  ?Mobility Comments: Community ambulator without AD, drives ?ADLs Comments: Independent ?  ? ? ?Hand Dominance  ?   ? ?  ?Extremity/Trunk Assessment  ? Upper Extremity Assessment ?Upper Extremity Assessment: Overall WFL for tasks assessed ?  ? ?Lower Extremity Assessment ?Lower Extremity Assessment: Overall WFL for tasks assessed ?  ? ?  Cervical / Trunk Assessment ?Cervical / Trunk Assessment: Normal  ?Communication  ? Communication: No difficulties  ?Cognition Arousal/Alertness: Awake/alert ?Behavior During Therapy: [facility] for tasks assessed/performed ?Overall Cognitive Status: Within Functional Limits for tasks assessed ?  ?  ?  ?  ?  ?  ?  ?  ?  ?  ?  ?  ?  ?  ?  ?  ?  ?  ?  ? ?  ?General Comments   ? ?  ?Exercises    ? ?Assessment/Plan  ?  ?PT Assessment Patient does not need any further PT services  ?PT Problem  List   ? ?   ?  ?PT Treatment Interventions     ? ?PT Goals (Current goals can be found in the Care Plan section)  ?Acute Rehab PT Goals ?Patient Stated Goal: return home with family to assist ?PT Goal Formulation: With patient/family ?Time For Goal Achievement: 07/02/21 ?Potential to Achieve Goals: Good ? ?  ?Frequency   ?  ? ? ?Co-evaluation   ?  ?  ?  ?  ? ? ?  ?AM-PAC PT "6 Clicks" Mobility  ?Outcome Measure Help needed turning from your back to your side while in a flat bed without using bedrails?: None ?Help needed moving from lying on your back to sitting on the side of a flat bed without using bedrails?: None ?Help needed moving to and from a bed to a chair (including a wheelchair)?: None ?Help needed standing up from a chair using your arms (e.g., wheelchair or bedside chair)?: None ?Help needed to walk in hospital room?: None ?Help needed climbing 3-5 steps with a railing? : None ?6 Click Score: 24 ? ?  ?End of Session   ?Activity Tolerance: Patient tolerated treatment well ?Patient left: in bed;with call bell/phone within reach ?Nurse Communication: Mobility status ?PT Visit Diagnosis: Unsteadiness on feet (R26.81);Other abnormalities of gait and mobility (R26.89);Muscle weakness (generalized) (M62.81) ?  ? ?Time: 2774-1287 ?PT Time Calculation (min) (ACUTE ONLY): 18 min ? ? ?Charges:   PT Evaluation ?$PT Eval Low Complexity: 1 Low ?PT Treatments ?$Therapeutic Activity: 8-22 mins ?  ?   ? ? ?1:50 PM, 07/02/21 ?Lonell Grandchild, MPT ?Physical Therapist with [place] ?[facility] ?[phone] office ?[phone] mobile phone ? ? ?

## 2023-09-24 ENCOUNTER — Encounter: Payer: Self-pay | Admitting: Advanced Practice Midwife
